# Patient Record
Sex: Female | Born: 1945 | Race: White | Hispanic: No | Marital: Married | Smoking: Former smoker
Health system: Southern US, Community
[De-identification: ages and names within clinical notes are randomized; demographics above are authoritative.]

## PROBLEM LIST (undated history)

## (undated) DIAGNOSIS — I10 Essential (primary) hypertension: Secondary | ICD-10-CM

## (undated) DIAGNOSIS — I255 Ischemic cardiomyopathy: Secondary | ICD-10-CM

## (undated) DIAGNOSIS — I2109 ST elevation (STEMI) myocardial infarction involving other coronary artery of anterior wall: Secondary | ICD-10-CM

## (undated) DIAGNOSIS — E785 Hyperlipidemia, unspecified: Secondary | ICD-10-CM

## (undated) DIAGNOSIS — E875 Hyperkalemia: Secondary | ICD-10-CM

## (undated) DIAGNOSIS — G40909 Epilepsy, unspecified, not intractable, without status epilepticus: Secondary | ICD-10-CM

## (undated) DIAGNOSIS — I2510 Atherosclerotic heart disease of native coronary artery without angina pectoris: Secondary | ICD-10-CM

## (undated) HISTORY — DX: ST elevation (STEMI) myocardial infarction involving other coronary artery of anterior wall: I21.09

## (undated) HISTORY — DX: Epilepsy, unspecified, not intractable, without status epilepticus: G40.909

## (undated) HISTORY — DX: Essential (primary) hypertension: I10

## (undated) HISTORY — DX: Atherosclerotic heart disease of native coronary artery without angina pectoris: I25.10

## (undated) HISTORY — DX: Ischemic cardiomyopathy: I25.5

## (undated) HISTORY — DX: Hyperkalemia: E87.5

## (undated) HISTORY — PX: NO PAST SURGERIES: SHX2092

## (undated) HISTORY — DX: Hyperlipidemia, unspecified: E78.5

---

## 2006-06-11 ENCOUNTER — Ambulatory Visit (HOSPITAL_COMMUNITY): Admission: RE | Admit: 2006-06-11 | Discharge: 2006-06-11 | Payer: Self-pay | Admitting: Family Medicine

## 2006-08-03 ENCOUNTER — Ambulatory Visit (HOSPITAL_COMMUNITY): Admission: RE | Admit: 2006-08-03 | Discharge: 2006-08-03 | Payer: Self-pay | Admitting: Family Medicine

## 2008-10-28 ENCOUNTER — Ambulatory Visit (HOSPITAL_COMMUNITY): Admission: RE | Admit: 2008-10-28 | Discharge: 2008-10-28 | Payer: Self-pay | Admitting: Family Medicine

## 2009-07-17 DIAGNOSIS — I2109 ST elevation (STEMI) myocardial infarction involving other coronary artery of anterior wall: Secondary | ICD-10-CM

## 2009-07-17 HISTORY — PX: CORONARY ANGIOPLASTY WITH STENT PLACEMENT: SHX49

## 2009-07-17 HISTORY — DX: ST elevation (STEMI) myocardial infarction involving other coronary artery of anterior wall: I21.09

## 2010-02-14 ENCOUNTER — Inpatient Hospital Stay (HOSPITAL_COMMUNITY): Admission: RE | Admit: 2010-02-14 | Discharge: 2010-02-17 | Payer: Self-pay

## 2010-02-14 ENCOUNTER — Ambulatory Visit: Payer: Self-pay | Admitting: Internal Medicine

## 2010-02-16 ENCOUNTER — Ambulatory Visit: Payer: Self-pay | Admitting: Internal Medicine

## 2010-02-16 ENCOUNTER — Encounter: Payer: Self-pay | Admitting: Cardiology

## 2010-03-07 ENCOUNTER — Ambulatory Visit: Payer: Self-pay | Admitting: Cardiology

## 2010-03-07 DIAGNOSIS — E782 Mixed hyperlipidemia: Secondary | ICD-10-CM | POA: Insufficient documentation

## 2010-03-07 DIAGNOSIS — I2510 Atherosclerotic heart disease of native coronary artery without angina pectoris: Secondary | ICD-10-CM | POA: Insufficient documentation

## 2010-03-07 DIAGNOSIS — I959 Hypotension, unspecified: Secondary | ICD-10-CM | POA: Insufficient documentation

## 2010-03-07 DIAGNOSIS — Z87891 Personal history of nicotine dependence: Secondary | ICD-10-CM | POA: Insufficient documentation

## 2010-03-10 ENCOUNTER — Encounter: Payer: Self-pay | Admitting: Cardiology

## 2010-03-10 LAB — CONVERTED CEMR LAB
Basophils Absolute: 0.1 10*3/uL (ref 0.0–0.1)
Cholesterol: 125 mg/dL (ref 0–200)
Eosinophils Absolute: 0.3 10*3/uL (ref 0.0–0.7)
HCT: 35.5 % — ABNORMAL LOW (ref 36.0–46.0)
HDL: 39.1 mg/dL (ref >39.00)
LDL Cholesterol: 71 mg/dL (ref 0–99)
Lymphs Abs: 3 10*3/uL (ref 0.7–4.0)
MCHC: 34.3 g/dL (ref 30.0–36.0)
MCV: 94.6 fL (ref 78.0–100.0)
Monocytes Absolute: 0.9 10*3/uL (ref 0.1–1.0)
Neutro Abs: 5.4 10*3/uL (ref 1.4–7.7)
RBC: 3.76 M/uL — ABNORMAL LOW (ref 3.87–5.11)
Sodium: 141 meq/L (ref 135–145)
Total CHOL/HDL Ratio: 3
VLDL: 14.6 mg/dL (ref 0.0–40.0)
WBC: 9.7 10*3/uL (ref 4.5–10.5)

## 2010-03-22 ENCOUNTER — Telehealth: Payer: Self-pay | Admitting: Cardiology

## 2010-04-11 ENCOUNTER — Encounter: Payer: Self-pay | Admitting: Cardiology

## 2010-05-11 ENCOUNTER — Ambulatory Visit: Payer: Self-pay | Admitting: Cardiology

## 2010-05-11 ENCOUNTER — Ambulatory Visit: Payer: Self-pay | Admitting: Cardiovascular Disease

## 2010-05-11 ENCOUNTER — Encounter: Payer: Self-pay | Admitting: Cardiology

## 2010-05-11 ENCOUNTER — Ambulatory Visit: Payer: Self-pay

## 2010-05-11 ENCOUNTER — Ambulatory Visit (HOSPITAL_COMMUNITY): Admission: RE | Admit: 2010-05-11 | Discharge: 2010-05-11 | Payer: Self-pay | Admitting: Cardiology

## 2010-05-13 LAB — CONVERTED CEMR LAB
CO2: 29 meq/L (ref 19–32)
Calcium: 9.4 mg/dL (ref 8.4–10.5)
Creatinine, Ser: 0.7 mg/dL (ref 0.4–1.2)
GFR calc non Af Amer: 90.72 mL/min (ref >60)
Glucose, Bld: 77 mg/dL (ref 70–99)
Potassium: 4.6 meq/L (ref 3.5–5.1)

## 2010-05-17 ENCOUNTER — Encounter: Payer: Self-pay | Admitting: Cardiology

## 2010-05-19 ENCOUNTER — Ambulatory Visit: Payer: Self-pay | Admitting: Cardiology

## 2010-05-19 ENCOUNTER — Inpatient Hospital Stay (HOSPITAL_COMMUNITY): Admission: RE | Admit: 2010-05-19 | Discharge: 2010-05-20 | Payer: Self-pay | Admitting: Cardiology

## 2010-06-08 ENCOUNTER — Ambulatory Visit: Payer: Self-pay | Admitting: Cardiology

## 2010-08-08 ENCOUNTER — Ambulatory Visit
Admission: RE | Admit: 2010-08-08 | Discharge: 2010-08-08 | Payer: Self-pay | Source: Home / Self Care | Attending: Cardiology | Admitting: Cardiology

## 2010-08-16 NOTE — Progress Notes (Signed)
Summary: Need ECHO  Phone Note Outgoing Call   Call placed by: Julieta Gutting, RN, BSN,  March 22, 2010 1:35 PM Call placed to: Patient Summary of Call: Lauren She needs an echo repeated at the time of GXT.  Thanks Tom.  Echo scheduled on 04/13/10 arrive at 9:30, GXT after ECHO. Pt aware of ECHO. Initial call taken by: Julieta Gutting, RN, BSN,  March 22, 2010 1:40 PM

## 2010-08-16 NOTE — Letter (Signed)
Summary: Cardiac Catheterization Instructions- Main Lab  [facility], Main Office  [address]   [place], [place] [address]   Phone: [phone]  Fax: [phone]     05/17/2010 MRN: [id number]  Destiny Hoffman [address] [place], [place]  [address]  Dear Ms. STOFFEL,   You are scheduled for Cardiac Catheterization on Thursday May 19, 2010 with Dr. Riley Kill.  Please arrive at the [facility] at 12:00      noon on the day of your procedure.  1. DIET     _X___ Nothing to eat after midnight.  You may have clear liquids until 8:00 AM, then nothing by mouth.  2. MAKE SURE YOU TAKE YOUR ASPIRIN AND PLAVIX.  3. __X__ YOU MAY TAKE ALL of your remaining medications with a small amount of water.      4. Plan for one night stay - bring personal belongings (i.e. toothpaste, toothbrush, etc.)  5. Bring a current list of your medications and current insurance cards.  6. Must have a responsible person to drive you home.   7. Someone must be with you for the first 24 hours after you arrive home.  8. Please wear clothes that are easy to get on and off and wear slip-on shoes.  *Special note: Every effort is made to have your procedure done on time.  Occasionally there are emergencies that present themselves at the hospital that may cause delays.  Please be patient if a delay does occur.  If you have any questions after you get home, please call the office at the number listed above.  Julieta Gutting, RN, BSN

## 2010-08-16 NOTE — Miscellaneous (Signed)
Summary: Orders Update  Clinical Lists Changes  Orders: Added new Referral order of Cardiac Catheterization (Cardiac Cath) - Signed 

## 2010-08-16 NOTE — Cardiovascular Report (Signed)
Summary: Cath/Percutaneous Orders   Cath/Percutaneous Orders   Imported By: Roderic Ovens 05/23/2010 09:22:58  _____________________________________________________________________  External Attachment:    Type:   Image     Comment:   External Document

## 2010-08-16 NOTE — Assessment & Plan Note (Signed)
Summary: eph   Visit Type:  Post-hospital  CC:  No complains.  History of Present Illness: No chest pain since discharge from the hospital.  Has stopped smoking.  It was not hard for her.  When she was in the ICU, she had a pastor to come by, and they knew she smoked.  She committed to them that she would stop.    Problems Prior to Update: 1)  Hypotension  (ICD-458.9) 2)  Tobacco Use, Quit  (ICD-V15.82) 3)  Hypercholesterolemia Iia  (ICD-272.0) 4)  Hypercholesterolemia Iia  (ICD-272.0) 5)  Cad, Native Vessel  (ICD-414.01)  Current Medications (verified): 1)  Aspirin Ec 325 Mg Tbec (Aspirin) .... Take One Tablet By Mouth Daily 2)  Plavix 75 Mg Tabs (Clopidogrel Bisulfate) .... Take One Tablet By Mouth Daily 3)  Lisinopril 10 Mg Tabs (Lisinopril) .... Take One Tablet By Mouth Daily 4)  Metoprolol Tartrate 25 Mg Tabs (Metoprolol Tartrate) .... Take  1/2  Tablet By Mouth Twice A Day 5)  Nitrostat 0.4 Mg Subl (Nitroglycerin) .Marland Kitchen.. 1 Tablet Under Tongue At Onset of Chest Pain; You May Repeat Every 5 Minutes For Up To 3 Doses. 6)  Crestor 20 Mg Tabs (Rosuvastatin Calcium) .... Take 1 Tablet By Mouth Once A Day 7)  Phenobarbital 30 Mg Tabs (Phenobarbital) .... Take 1 Tablet By Mouth Two Times A Day 8)  Excedrin Migraine 250-250-65 Mg Tabs (Aspirin-Acetaminophen-Caffeine) .... As Needed  Allergies (verified): No Known Drug Allergies  Past History:  Past Medical History: Myocardial infarction 02/14/2010 Seizure disorder.  Family History: Father deceased secondary to MI age 56  Social History: Reviewed history and no changes required. Married. Two children.  Stop smoling at time of MI.   Vital Signs:  Patient profile:   65 year old female Height:      62 inches Weight:      102.75 pounds BMI:     18.86 Pulse rate:   56 / minute Pulse rhythm:   regular Resp:     18 per minute BP sitting:   96 / 58  (left arm) Cuff size:   small  Vitals Entered By: Vikki Ports (March 07, 2010 12:36 PM)  Physical Exam  General:  Thin older female in no distress at present.   Head:  normocephalic and atraumatic Eyes:  PERRLA/EOM intact; conjunctiva and lids normal. Neck:  No obvious JVD. Lungs:  Moderatte decrease breath sounds bilaterally.  No rales Heart:  PMI non displaced.  NOrmal S1 and S2.  Pos S4.  No murmur noted. Pulses:  pulses normal in all 4 extremities Extremities:  No clubbing or cyanosis. Neurologic:  Alert and oriented x 3.   EKG  Procedure date:  03/07/2010  Findings:      NSR.  T inversion anteriorly.  ST change with T change consistent with recent anterior MI.   Cardiac Cath  Procedure date:  02/14/2010  Findings:      NGIOGRAPHIC DATA: 1. On plain fluoroscopy, there was moderate calcification particularly     in the LAD. 2. Ventriculography in the RAO projection reveals an anteroapical wall     motion abnormality with estimated ejection fraction of 35-40%.  The     segment is akinetic.  There is mild mitral regurgitation. 3. The right coronary artery is a smaller caliber vessel.  There was a     segmental lesion of about 80%, the mid vessel then providing a     posterior descending and posterolateral branch posteriorly. 4. The left  main is free of critical disease. 5. The LAD courses to the apex just after the takeoff of the small     first diagonal, there is total occlusion of the LAD.  This was a     somewhat stiff lesion, but ultimately opened nicely with a balloon     and stent was reduced to 0% residual luminal narrowing.  The vessel     tapers distally, but provides an apical vessel and then supplies a     significant portion of the distal inferior wall.  There is probably     about 50% narrowing at the ostium of the first diagonal and a     little bit of mild pinching of the second diagonal, but no critical     stenoses. 6. The circumflex is a fairly large-caliber vessel, there are two     modest marginal branches followed by  about a 30% area of stenosis.     The vessel then goes posteriorly.  There is a distal marginal     branch that has about a 50% area of stenosis distally as well.  No     critical lesions are noted.   CONCLUSIONS: 1. Moderately severe reduction in left ventricular function. 2. Total occlusion of the left anterior descending artery acutely with     successful reperfusion using a non-drug-eluting stent. 3. High-grade lesion of the mid right coronary artery. 4. Total balloon time of 20 minutes.   DISPOSITION: 1. The patient will be treated with aspirin and Plavix. 2. P2Y12 testing will be performed. 3. Cardiac rehab. 4. Dual antiplatelet therapy. 5. Beta blockade. 6. ACE inhibition. 7. Statin therapy.  Echocardiogram  Procedure date:  02/16/2010  Findings:       Study Conclusions   Left ventricle: LVEF is severely depressed at approximately 20 to   25% with akinesis of the mid/distal septum, mid,distal anterior,   distal inferior, distal lateral, and apical walls. The cavity size   was normal. Wall thickness was normal.   Impression & Recommendations:  Problem # 1:  MYOCARDIAL  INFARCTION, ANTEROLATERAL WALL, SUBSEQUENT CARE (ICD-410.02) Appears to be doing well.  Has residual RCA discease, but has sig reduction in LVEF.  Currently class I.  Will need subsequent echo done in about six weeks to reassess status, then decision re: consideration of ICD.  Because of size and BP, medicatons will be difficult to optimize in terms of LV reduction and remodeling.  Her updated medication list for this problem includes:    Aspirin Ec 325 Mg Tbec (Aspirin) .Marland Kitchen... Take one tablet by mouth daily    Plavix 75 Mg Tabs (Clopidogrel bisulfate) .Marland Kitchen... Take one tablet by mouth daily    Lisinopril 10 Mg Tabs (Lisinopril) .Marland Kitchen... Take one-half  tablet by mouth daily    Metoprolol Tartrate 25 Mg Tabs (Metoprolol tartrate) .Marland Kitchen... Take  1/2  tablet by mouth twice a day    Nitrostat 0.4 Mg Subl  (Nitroglycerin) .Marland Kitchen... 1 tablet under tongue at onset of chest pain; you may repeat every 5 minutes for up to 3 doses.  Problem # 2:  CAD, NATIVE VESSEL (ICD-414.01) residual disease of the RCA.  At present, would hold off as she is not symptomatic.  Her updated medication list for this problem includes:    Aspirin Ec 325 Mg Tbec (Aspirin) .Marland Kitchen... Take one tablet by mouth daily    Plavix 75 Mg Tabs (Clopidogrel bisulfate) .Marland Kitchen... Take one tablet by mouth daily    Lisinopril 10  Mg Tabs (Lisinopril) .Marland Kitchen... Take one-half  tablet by mouth daily    Metoprolol Tartrate 25 Mg Tabs (Metoprolol tartrate) .Marland Kitchen... Take  1/2  tablet by mouth twice a day    Nitrostat 0.4 Mg Subl (Nitroglycerin) .Marland Kitchen... 1 tablet under tongue at onset of chest pain; you may repeat every 5 minutes for up to 3 doses.  Orders: EKG w/ Interpretation (93[address]) TLB-BMP (Basic Metabolic Panel-BMET) (80048-METABOL) TLB-CBC Platelet - w/Differential (85025-CBCD) TLB-Lipid Panel (80061-LIPID) TLB-Hepatic/Liver Function Pnl (80076-HEPATIC) Treadmill (Treadmill)  Problem # 3:  HYPOTENSION (ICD-458.9) Will reduce Lisinopril to 5mg  per day at present, and monitor BP with early followup.  Problem # 4:  HYPERCHOLESTEROLEMIA  IIA (ICD-272.0) On LLT post MI.  Will need recheck in near future.  Her updated medication list for this problem includes:    Crestor 20 Mg Tabs (Rosuvastatin calcium) .Marland Kitchen... Take 1 tablet by mouth once a day  Orders: EKG w/ Interpretation (93000) TLB-BMP (Basic Metabolic Panel-BMET) (80048-METABOL) TLB-CBC Platelet - w/Differential (85025-CBCD) TLB-Lipid Panel (80061-LIPID) TLB-Hepatic/Liver Function Pnl (80076-HEPATIC) Treadmill (Treadmill)  Patient Instructions: 1)  Your physician recommends that you have lab work today: CBC, BMP, LIPID, LIVER 2)  Your physician has requested that you have an exercise tolerance test.  For further information please visit [email or web address]  Please also follow instruction sheet, as  given. 3)  Your physician has recommended you make the following change in your medication: DECREASE Lisinopril to 10mg  take one-half tablet by mouth daily

## 2010-08-16 NOTE — Assessment & Plan Note (Signed)
Summary: EPH PER NIKKI CALL FROM [facility]   Visit Type:  Follow-up Primary Provider:  Dr.Angus Mcinnis   History of Present Illness: Ms. Jenkin is in for followup after undergoing repeat cardiac cath.  Overall she is stable and feeling well.  She is able to walk without chest pain, and getting around without difficulty.  We spent a long time reviewing her films today, talking about symptoms and possible strategies.  She is asymptomatic, with a patent stent in her acute infarct area, and high grade residual in the RCA.  However, this vessel is small in caliber, has a lengthy lesion, and is less than ideal for PCI.  We have offered her two options and discussed the contingencies and issues with both.  She prefers a watchful waiting, conservative course of management in the absence of progressive symptoms.   Current Medications (verified): 1)  Aspirin Ec 325 Mg Tbec (Aspirin) .... Take One Tablet By Mouth Daily 2)  Plavix 75 Mg Tabs (Clopidogrel Bisulfate) .... Take One Tablet By Mouth Daily 3)  Metoprolol Tartrate 25 Mg Tabs (Metoprolol Tartrate) .... Take  1/2  Tablet By Mouth Twice A Day 4)  Nitrostat 0.4 Mg Subl (Nitroglycerin) .Marland Kitchen.. 1 Tablet Under Tongue At Onset of Chest Pain; You May Repeat Every 5 Minutes For Up To 3 Doses. 5)  Crestor 20 Mg Tabs (Rosuvastatin Calcium) .... Take 1 Tablet By Mouth Once A Day 6)  Phenobarbital 30 Mg Tabs (Phenobarbital) .... Take 1 Tablet By Mouth Two Times A Day 7)  Excedrin Migraine 250-250-65 Mg Tabs (Aspirin-Acetaminophen-Caffeine) .... As Needed  Allergies (verified): No Known Drug Allergies  Comments:  Nurse/Medical Assistant: patient and i reviewed meds and she stated that all meds are correct  Vital Signs:  Patient profile:   65 year old female Weight:      108 pounds BMI:     19.82 Pulse rate:   66 / minute BP sitting:   112 / 66  (right arm)  Vitals Entered By: Dreama Saa, CNA (June 08, 2010 10:23 AM)  Physical  Exam  General:  Well developed, well nourished, in no acute distress. Head:  normocephalic and atraumatic Eyes:  PERRLA/EOM intact; conjunctiva and lids normal. Lungs:  Clear bilaterally to auscultation and percussion. Heart:  PMI non displaced.  Normal S1 and S2.  No murmurs or rubs.  Msk:  Back normal, normal gait. Muscle strength and tone normal. Pulses:  pulses normal in all 4 extremities Extremities:  Groin stable. Neurologic:  Alert and oriented x 3.   Impression & Recommendations:  Problem # 1:  CAD, NATIVE VESSEL (ICD-414.01) She will remain on DAPT.  All issues discussed in detail with her, and she prefers a conservative course of action. Will monitor closely. Her updated medication list for this problem includes:    Aspirin Ec 325 Mg Tbec (Aspirin) .Marland Kitchen... Take one tablet by mouth daily    Plavix 75 Mg Tabs (Clopidogrel bisulfate) .Marland Kitchen... Take one tablet by mouth daily    Metoprolol Tartrate 25 Mg Tabs (Metoprolol tartrate) .Marland Kitchen... Take  1/2  tablet by mouth twice a day    Nitrostat 0.4 Mg Subl (Nitroglycerin) .Marland Kitchen... 1 tablet under tongue at onset of chest pain; you may repeat every 5 minutes for up to 3 doses.  Problem # 2:  HYPERCHOLESTEROLEMIA  IIA (ICD-272.0) remains on lipid lowering treatment at present.  Her updated medication list for this problem includes:    Crestor 20 Mg Tabs (Rosuvastatin calcium) .Marland Kitchen... Take 1 tablet by  mouth once a day  Patient Instructions: 1)  Your physician recommends that you schedule a follow-up appointment in: 2 MONTHS

## 2010-08-16 NOTE — Medication Information (Signed)
Summary: Lab Orders  Lab Orders   Imported By: Marylou Mccoy 04/25/2010 10:41:17  _____________________________________________________________________  External Attachment:    Type:   Image     Comment:   External Document

## 2010-08-16 NOTE — Medication Information (Signed)
Summary: Physician's Orders   Physician's Orders   Imported By: Roderic Ovens 04/15/2010 12:05:51  _____________________________________________________________________  External Attachment:    Type:   Image     Comment:   External Document

## 2010-08-18 NOTE — Assessment & Plan Note (Signed)
Summary: F2M   Visit Type:  Follow-up Primary Provider:  Dr.Angus Mcinnis  CC:  No complaints.  History of Present Illness: She is doing well.  Her friends will not walk with her any more because she moves to fast. She denies any symptoms at all on optimal medical therapy.  She feels great.  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  Problems Prior to Update: 1)  Myocardial Infarction, Anterolateral Wall, Subsequent Care  (ICD-410.02) 2)  Hypotension  (ICD-458.9) 3)  Tobacco Use, Quit  (ICD-V15.82) 4)  Hypercholesterolemia Iia  (ICD-272.0) 5)  Hypercholesterolemia Iia  (ICD-272.0) 6)  Cad, Native Vessel  (ICD-414.01)  Current Medications (verified): 1)  Plavix 75 Mg Tabs (Clopidogrel Bisulfate) .... Take One Tablet By Mouth Daily 2)  Metoprolol Tartrate 25 Mg Tabs (Metoprolol Tartrate) .... Take  1/2  Tablet By Mouth Twice A Day 3)  Nitrostat 0.4 Mg Subl (Nitroglycerin) .Marland Kitchen.. 1 Tablet Under Tongue At Onset of Chest Pain; You May Repeat Every 5 Minutes For Up To 3 Doses. 4)  Crestor 20 Mg Tabs (Rosuvastatin Calcium) .... Take 1 Tablet By Mouth Once A Day 5)  Phenobarbital 30 Mg Tabs (Phenobarbital) .... Take 1 Tablet By Mouth Two Times A Day 6)  Excedrin Migraine 250-250-65 Mg Tabs (Aspirin-Acetaminophen-Caffeine) .... As Needed 7)  Aspirin 81 Mg Tbec (Aspirin) .... Take One Tablet By Mouth Daily  Allergies (verified): No Known Drug Allergies  Vital Signs:  Patient profile:   65 year old female Height:      62 inches Weight:      113.75 pounds BMI:     20.88 Pulse rate:   59 / minute Pulse rhythm:   regular Resp:     18 per minute BP sitting:   120 / 74  (left arm) Cuff size:   large  Vitals Entered By: Vikki Ports (August 08, 2010 10:16 AM)  Physical Exam  General:  Thin, well kept female no distress. Head:  normocephalic and atraumatic Eyes:  PERRLA/EOM intact; conjunctiva and lids  normal. Lungs:  Clear bilaterally to auscultation and percussion. Heart:  PMI non displaced. Normal S1 and S2.  No murmurs.  Extremities:  No clubbing or cyanosis. Neurologic:  Alert and oriented x 3.   Impression & Recommendations:  Problem # 1:  CAD, NATIVE VESSEL (ICD-414.01) Doing really well on medication.  No symptoms.  Plans well documented in prior notes.  Reevaluate in three months, and meanwhile continue optimal medical therapy.  She wants to return to fifteen hours of light work, and I think that is ok. The following medications were removed from the medication list:    Aspirin Ec 325 Mg Tbec (Aspirin) .Marland Kitchen... Take one tablet by mouth daily Her updated medication list for this problem includes:    Plavix 75 Mg Tabs (Clopidogrel bisulfate) .Marland Kitchen... Take one tablet by mouth daily    Metoprolol Tartrate 25 Mg Tabs (Metoprolol tartrate) .Marland Kitchen... Take  1/2  tablet by mouth twice a day    Nitrostat 0.4 Mg Subl (Nitroglycerin) .Marland Kitchen... 1 tablet under tongue at onset of chest pain; you may repeat every 5 minutes for up to 3 doses.    Aspirin 81 Mg Tbec (Aspirin) .Marland Kitchen... Take one tablet by mouth daily  Problem # 2:  HYPERCHOLESTEROLEMIA  IIA (ICD-272.0)  Follows with Dr. Renard Matter.  Her updated medication list for this problem includes:    Crestor 20 Mg Tabs (Rosuvastatin  calcium) .Marland Kitchen... Take 1 tablet by mouth once a day  Her updated medication list for this problem includes:    Crestor 20 Mg Tabs (Rosuvastatin calcium) .Marland Kitchen... Take 1 tablet by mouth once a day  Problem # 3:  TOBACCO USE, QUIT (ICD-V15.82) Has not restarted.   Patient Instructions: 1)  Your physician recommends that you continue on your current medications as directed. Please refer to the Current Medication list given to you today. 2)  Your physician wants you to follow-up in: 3 MONTHS.   You will receive a reminder letter in the mail two months in advance. If you don't receive a letter, please call our office to schedule the  follow-up appointment. Prescriptions: CRESTOR 20 MG TABS (ROSUVASTATIN CALCIUM) Take 1 tablet by mouth once a day  #30 x 8   Entered by:   Julieta Gutting, RN, BSN   Authorized by:   Ronaldo Miyamoto, MD, [facility]   Signed by:   Julieta Gutting, RN, BSN on 08/08/2010   Method used:   Electronically to        [facility] (retail)       [address]       [place], [place]  [address]       Ph: [phone]       Fax: [phone]   RxID:   [id number] METOPROLOL TARTRATE 25 MG TABS (METOPROLOL TARTRATE) Take  1/2  tablet by mouth twice a day  #30 x 8   Entered by:   Julieta Gutting, RN, BSN   Authorized by:   Ronaldo Miyamoto, MD, [facility]   Signed by:   Julieta Gutting, RN, BSN on 08/08/2010   Method used:   Electronically to        [facility] (retail)       [address]       [place], [place]  [address]       Ph: [phone]       Fax: [phone]   RxID:   [id number] PLAVIX 75 MG TABS (CLOPIDOGREL BISULFATE) Take one tablet by mouth daily  #30 x 8   Entered by:   Julieta Gutting, RN, BSN   Authorized by:   Ronaldo Miyamoto, MD, [facility]   Signed by:   Julieta Gutting, RN, BSN on 08/08/2010   Method used:   Electronically to        [facility] (retail)       [address]       [place], [place]  [address]       Ph: [phone]       Fax: [phone]   RxID:   [id number]

## 2010-09-27 LAB — POCT I-STAT 3, ART BLOOD GAS (G3+)
Acid-base deficit: 2 mmol/L (ref 0.0–2.0)
Bicarbonate: 22.9 mEq/L (ref 20.0–24.0)
pH, Arterial: 7.389 (ref 7.350–7.400)
pO2, Arterial: 72 mmHg — ABNORMAL LOW (ref 80.0–100.0)

## 2010-09-27 LAB — POCT I-STAT 3, VENOUS BLOOD GAS (G3P V)
Bicarbonate: 21.9 mEq/L (ref 20.0–24.0)
O2 Saturation: 64 %
TCO2: 23 mmol/L (ref 0–100)
pCO2, Ven: 39.5 mmHg — ABNORMAL LOW (ref 45.0–50.0)
pO2, Ven: 35 mmHg (ref 30.0–45.0)

## 2010-09-27 LAB — PLATELET INHIBITION P2Y12: P2Y12 % Inhibition: 79 %

## 2010-09-27 LAB — BASIC METABOLIC PANEL
Calcium: 9.4 mg/dL (ref 8.4–10.5)
Chloride: 107 mEq/L (ref 96–112)
Chloride: 110 mEq/L (ref 96–112)
Glucose, Bld: 92 mg/dL (ref 70–99)
Potassium: 5.5 mEq/L — ABNORMAL HIGH (ref 3.5–5.1)
Sodium: 141 mEq/L (ref 135–145)

## 2010-09-27 LAB — CBC
HCT: 37.1 % (ref 36.0–46.0)
Hemoglobin: 11.9 g/dL — ABNORMAL LOW (ref 12.0–15.0)
MCH: 30.7 pg (ref 26.0–34.0)
MCH: 30.8 pg (ref 26.0–34.0)
MCHC: 32.1 g/dL (ref 30.0–36.0)
MCV: 95.1 fL (ref 78.0–100.0)
MCV: 96.1 fL (ref 78.0–100.0)
Platelets: 186 10*3/uL (ref 150–400)
Platelets: 203 10*3/uL (ref 150–400)
RBC: 4.11 MIL/uL (ref 3.87–5.11)

## 2010-09-27 LAB — PROTIME-INR
INR: 0.94 (ref 0.00–1.49)
Prothrombin Time: 12.8 seconds (ref 11.6–15.2)

## 2010-09-30 LAB — CBC
HCT: 35.3 % — ABNORMAL LOW (ref 36.0–46.0)
HCT: 35.6 % — ABNORMAL LOW (ref 36.0–46.0)
Hemoglobin: 12.1 g/dL (ref 12.0–15.0)
MCH: 31.4 pg (ref 26.0–34.0)
MCHC: 34.3 g/dL (ref 30.0–36.0)
MCV: 96.3 fL (ref 78.0–100.0)
MCV: 98.9 fL (ref 78.0–100.0)
Platelets: 214 10*3/uL (ref 150–400)
RBC: 3.66 MIL/uL — ABNORMAL LOW (ref 3.87–5.11)
RDW: 12.5 % (ref 11.5–15.5)
RDW: 12.6 % (ref 11.5–15.5)
WBC: 14.1 10*3/uL — ABNORMAL HIGH (ref 4.0–10.5)

## 2010-09-30 LAB — COMPREHENSIVE METABOLIC PANEL
AST: 470 U/L — ABNORMAL HIGH (ref 0–37)
Albumin: 3.6 g/dL (ref 3.5–5.2)
BUN: 15 mg/dL (ref 6–23)
CO2: 21 mEq/L (ref 19–32)
Calcium: 8.5 mg/dL (ref 8.4–10.5)
Chloride: 107 mEq/L (ref 96–112)
Creatinine, Ser: 0.61 mg/dL (ref 0.4–1.2)
GFR calc non Af Amer: >60 mL/min (ref >60)
Total Protein: 6.1 g/dL (ref 6.0–8.3)

## 2010-09-30 LAB — PLATELET INHIBITION P2Y12
P2Y12 % Inhibition: 31 %
P2Y12 % Inhibition: 49 %
Platelet Function  P2Y12: 172 [PRU] — ABNORMAL LOW (ref 194–418)
Platelet Function Baseline: 377 [PRU] (ref 194–418)
Platelet Function Baseline: 391 [PRU] (ref 194–418)

## 2010-09-30 LAB — BASIC METABOLIC PANEL
BUN: 13 mg/dL (ref 6–23)
CO2: 28 mEq/L (ref 19–32)
Chloride: 107 mEq/L (ref 96–112)
Creatinine, Ser: 0.69 mg/dL (ref 0.4–1.2)
Glucose, Bld: 127 mg/dL — ABNORMAL HIGH (ref 70–99)
Potassium: 4.7 mEq/L (ref 3.5–5.1)

## 2010-09-30 LAB — CARDIAC PANEL(CRET KIN+CKTOT+MB+TROPI)
CK, MB: 708.5 ng/mL (ref 0.3–4.0)
Relative Index: 10.5 — ABNORMAL HIGH (ref 0.0–2.5)
Troponin I: >100 ng/mL (ref 0.00–0.06)

## 2010-09-30 LAB — MRSA PCR SCREENING: MRSA by PCR: NEGATIVE

## 2010-09-30 LAB — POCT I-STAT, CHEM 8
Chloride: 108 mEq/L (ref 96–112)
HCT: 37 % (ref 36.0–46.0)
Potassium: 3.8 mEq/L (ref 3.5–5.1)

## 2010-09-30 LAB — PENTOBARBITAL, SERUM: Pentobarbital, Serum: <2 ug/mL

## 2010-12-02 ENCOUNTER — Encounter: Payer: Self-pay | Admitting: Cardiology

## 2010-12-07 ENCOUNTER — Ambulatory Visit (INDEPENDENT_AMBULATORY_CARE_PROVIDER_SITE_OTHER): Payer: Medicare Other | Admitting: Cardiology

## 2010-12-07 ENCOUNTER — Encounter: Payer: Self-pay | Admitting: Cardiology

## 2010-12-07 VITALS — BP 117/71 | HR 66 | Ht 62.0 in | Wt 123.0 lb

## 2010-12-07 DIAGNOSIS — I2510 Atherosclerotic heart disease of native coronary artery without angina pectoris: Secondary | ICD-10-CM

## 2010-12-07 DIAGNOSIS — E7800 Pure hypercholesterolemia, unspecified: Secondary | ICD-10-CM

## 2010-12-07 NOTE — Patient Instructions (Signed)
Your physician recommends that you schedule a follow-up appointment in: 4 MONTHS  Your physician recommends that you continue on your current medications as directed. Please refer to the Current Medication list given to you today.   

## 2010-12-18 NOTE — Assessment & Plan Note (Signed)
Recent labs reveal LDL 60 mg/dl, TC 811, and non HDL chol  70.  Would continue on rosuvastatin.

## 2010-12-18 NOTE — Progress Notes (Signed)
HPI:  Still walking a lot. Having no significant symptoms at the present time.  Denies any chest tightness.  Her treatment plan has been extensively reviewed and she knows to call us with any change in status.    Current Outpatient Prescriptions  Medication Sig Dispense Refill  . aspirin 81 MG tablet Take 81 mg by mouth daily.        Marland Kitchen aspirin-acetaminophen-caffeine (EXCEDRIN MIGRAINE) 250-250-65 MG per tablet Take 1 tablet by mouth every 6 (six) hours as needed.        . clopidogrel (PLAVIX) 75 MG tablet Take 75 mg by mouth daily.        . metoprolol tartrate (LOPRESSOR) 25 MG tablet 1/2 tab po bid       . nitroGLYCERIN (NITROSTAT) 0.4 MG SL tablet Place 0.4 mg under the tongue every 5 (five) minutes as needed.        Marland Kitchen PHENObarbital (LUMINAL) 30 MG tablet Take 30 mg by mouth 2 (two) times daily.        . rosuvastatin (CRESTOR) 20 MG tablet Take 20 mg by mouth daily.          No Known Allergies  Past Medical History  Diagnosis Date  . Myocardial infarction   . Seizure disorder     Past Surgical History  Procedure Date  . 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     Family History  Problem Relation Age of Onset  . Heart attack      History   Social History  . Marital Status: Married    Spouse Name: N/A    Number of Children: N/A  . Years of Education: N/A   Occupational History  . Not on file.   Social History Main Topics  . Smoking status: Never Smoker   . Smokeless tobacco: Never Used  . Alcohol Use: No  . Drug Use: No  . Sexually Active: Not on file   Other Topics Concern  . Not on file   Social History Narrative  . No narrative on file    ROS: Please see the HPI.  All other systems reviewed and negative.  PHYSICAL EXAM:  BP 117/71  Pulse 66  Ht 5\' 2"  (1.575 m)  Wt 123 lb (55.792 kg)  BMI 22.50 kg/m2  General: Well developed, well nourished, in no acute  distress. Head:  Normocephalic and atraumatic. Neck: no JVD Lungs: Clear to auscultation and percussion. Heart: Normal S1 and S2.  No murmur, rubs or gallops.  Abdomen:  Normal bowel sounds; soft; non tender; no organomegaly Pulses: Pulses normal in all 4 extremities. Extremities: No clubbing or cyanosis. No edema. Neurologic: Alert and oriented x 3.  EKG:  ASSESSMENT AND PLAN:

## 2010-12-18 NOTE — Assessment & Plan Note (Signed)
After extensive review, we recommended continued medical therapy based on her anatomy.  She knows to report any change in symptoms.  I encouraged her not to smoke.  She understands.  She walks regularly, and a good distance without any anginal type symptoms.  We will continue to follow her.

## 2011-04-10 ENCOUNTER — Ambulatory Visit (INDEPENDENT_AMBULATORY_CARE_PROVIDER_SITE_OTHER): Payer: Medicare Other | Admitting: Cardiology

## 2011-04-10 ENCOUNTER — Encounter: Payer: Self-pay | Admitting: Cardiology

## 2011-04-10 DIAGNOSIS — I2510 Atherosclerotic heart disease of native coronary artery without angina pectoris: Secondary | ICD-10-CM

## 2011-04-10 DIAGNOSIS — E7800 Pure hypercholesterolemia, unspecified: Secondary | ICD-10-CM

## 2011-04-10 LAB — LIPID PANEL
Cholesterol: 127 mg/dL (ref 0–200)
LDL Cholesterol: 44 mg/dL (ref 0–99)
Total CHOL/HDL Ratio: 2
Triglycerides: 116 mg/dL (ref 0.0–149.0)

## 2011-04-10 LAB — HEPATIC FUNCTION PANEL
AST: 27 U/L (ref 0–37)
Albumin: 4.5 g/dL (ref 3.5–5.2)
Alkaline Phosphatase: 77 U/L (ref 39–117)
Bilirubin, Direct: 0.1 mg/dL (ref 0.0–0.3)

## 2011-04-10 MED ORDER — METOPROLOL TARTRATE 25 MG PO TABS: ORAL_TABLET | ORAL | Status: DC

## 2011-04-10 MED ORDER — ROSUVASTATIN CALCIUM 20 MG PO TABS: 20.0000 mg | ORAL_TABLET | Freq: Every day | ORAL | Status: DC

## 2011-04-10 NOTE — Patient Instructions (Addendum)
Your physician recommends that you have a FASTING lipid and liver profile today.   Your physician wants you to follow-up in: 6 MONTHS. You will receive a reminder letter in the mail two months in advance. If you don't receive a letter, please call our office to schedule the follow-up appointment.  Your physician has recommended you make the following change in your medication: STOP Plavix

## 2011-04-10 NOTE — Progress Notes (Signed)
HPI:  She has been perfectly stable on a medical regimen.  She does not bleed excessively, but admits that she can get wobbly.  Her P2Y12 is quite surpressed by clopidogrel.  No current symptoms.  Now out more than a year from her MI.  She has been playing on the internet, and not walking as much.   Current Outpatient Prescriptions  Medication Sig Dispense Refill  . aspirin 81 MG tablet Take 81 mg by mouth daily.        Marland Kitchen aspirin-acetaminophen-caffeine (EXCEDRIN MIGRAINE) 250-250-65 MG per tablet Take 1 tablet by mouth every 6 (six) hours as needed.        . clopidogrel (PLAVIX) 75 MG tablet Take 75 mg by mouth daily.        . metoprolol tartrate (LOPRESSOR) 25 MG tablet 1/2 tab po bid       . nitroGLYCERIN (NITROSTAT) 0.4 MG SL tablet Place 0.4 mg under the tongue every 5 (five) minutes as needed.        Marland Kitchen PHENObarbital (LUMINAL) 30 MG tablet Take 30 mg by mouth 2 (two) times daily.        . rosuvastatin (CRESTOR) 20 MG tablet Take 20 mg by mouth daily.          No Known Allergies  Past Medical History  Diagnosis Date  . Myocardial infarction   . Seizure disorder     Past Surgical History  Procedure Date  . 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     Family History  Problem Relation Age of Onset  . Heart attack      History   Social History  . Marital Status: Married    Spouse Name: N/A    Number of Children: N/A  . Years of Education: N/A   Occupational History  . Not on file.   Social History Main Topics  . Smoking status: Never Smoker   . Smokeless tobacco: Never Used  . Alcohol Use: No  . Drug Use: No  . Sexually Active: Not on file   Other Topics Concern  . Not on file   Social History Narrative  . No narrative on file    ROS: Please see the HPI.  All other systems reviewed and negative.  PHYSICAL EXAM:  BP 114/70  Pulse 56  Resp 18  Ht 5\' 2"  (1.575 m)  Wt  128 lb 12.8 oz (58.423 kg)  BMI 23.56 kg/m2  General: Well developed, well nourished, in no acute distress. Head:  Normocephalic and atraumatic. Neck: no JVD Lungs: Clear to auscultation and percussion. Heart: Normal S1 and S2.  No murmur, rubs or gallops.  Pulses: Pulses normal in all 4 extremities. Extremities: No clubbing or cyanosis. No edema. Neurologic: Alert and oriented x 3.  EKG:  NSR.  Anterior T changes, no acute. Prior anterior MI.   ASSESSMENT AND PLAN:

## 2011-04-10 NOTE — Assessment & Plan Note (Signed)
Doing well.  Encouraged to walk more.  Had BMS, and out more than a year.  Will stop clopidogrel at this point.  Her risk exceeds her benefit.  She does have other vessel disease, and I am encouraging her to walk more.

## 2011-04-10 NOTE — Assessment & Plan Note (Signed)
Time to check lipid and liver.

## 2011-06-07 IMAGING — CR DG CHEST 2V
2 series · 2 of 2 positions shown · non-contrast
Comparison: 10/28/2008

CLINICAL DATA: Myocardial infarction.  Cough and hypertension.

CHEST - 2 VIEW

[w chest pa]
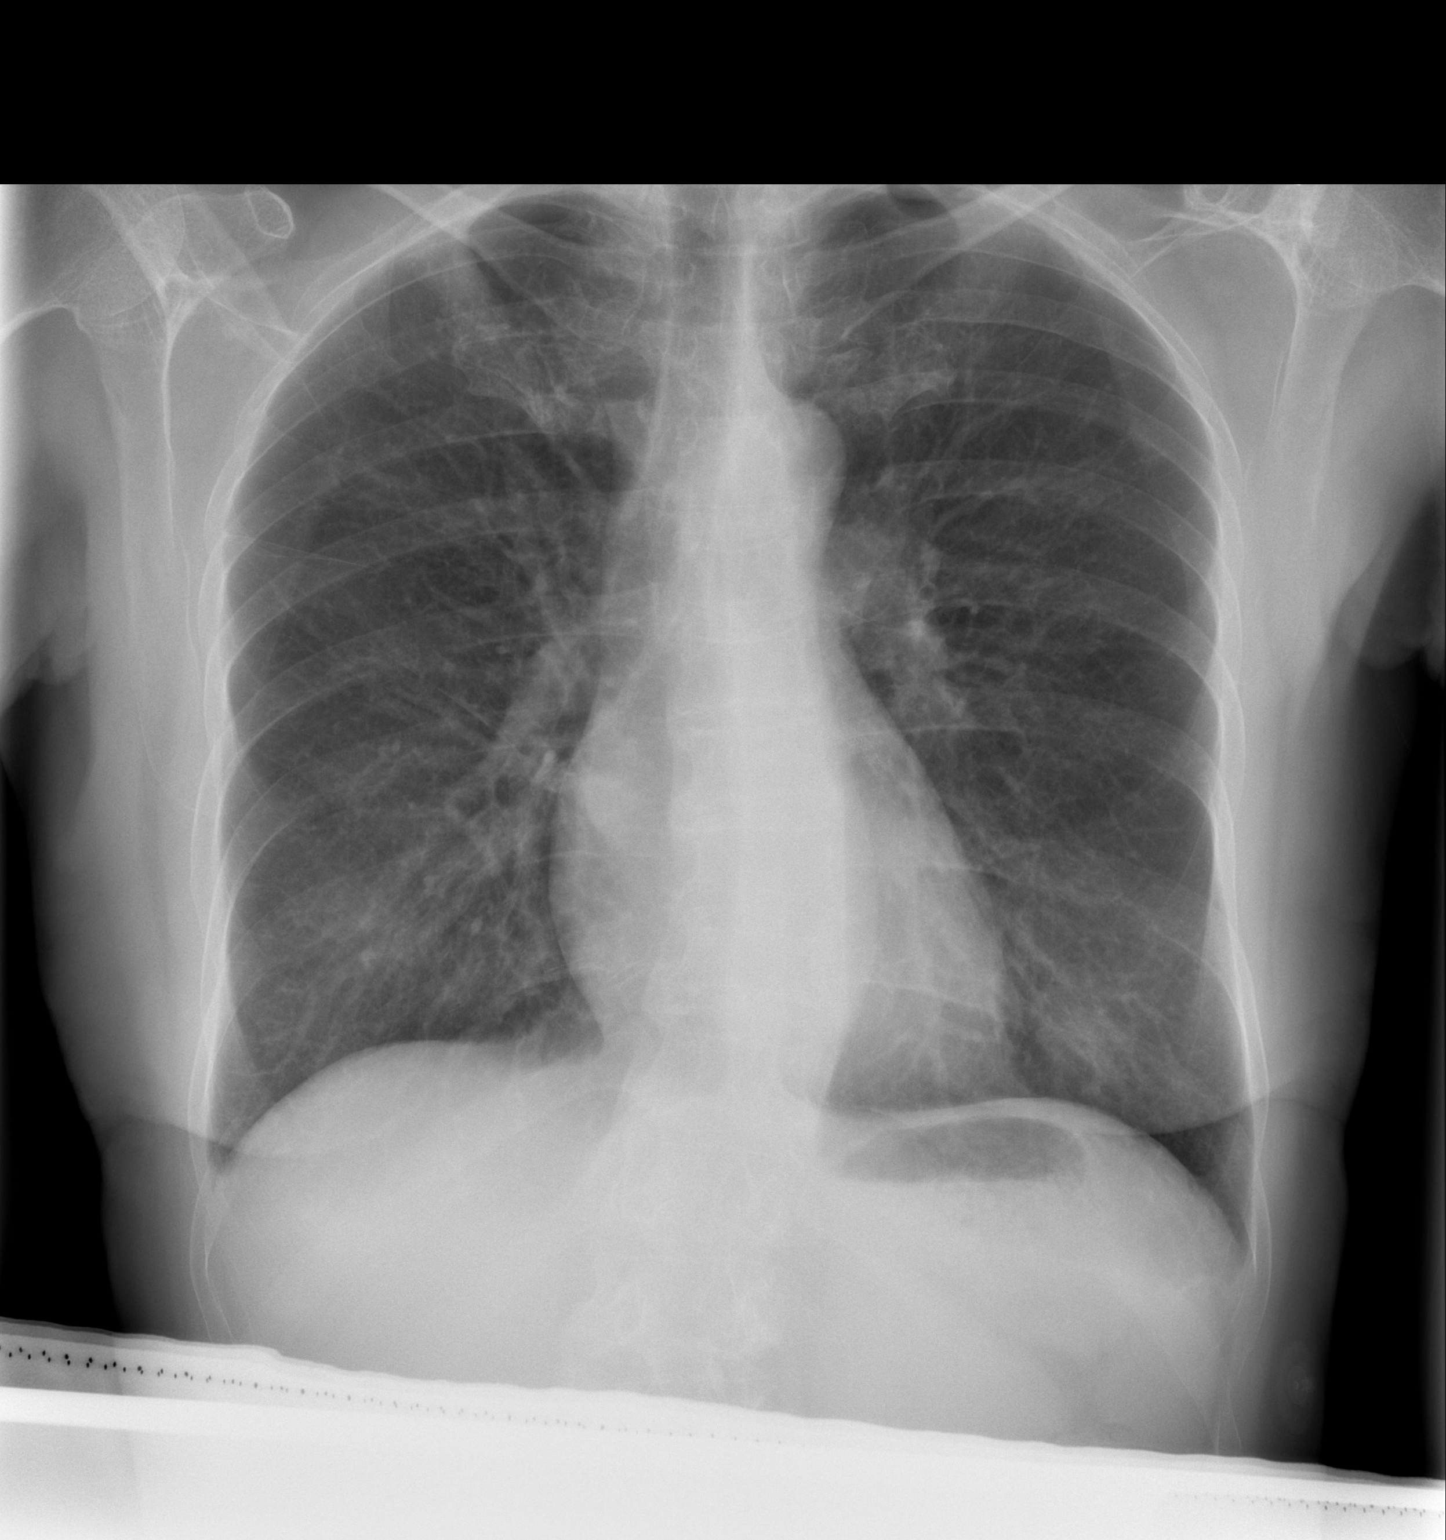

[w chest lat]
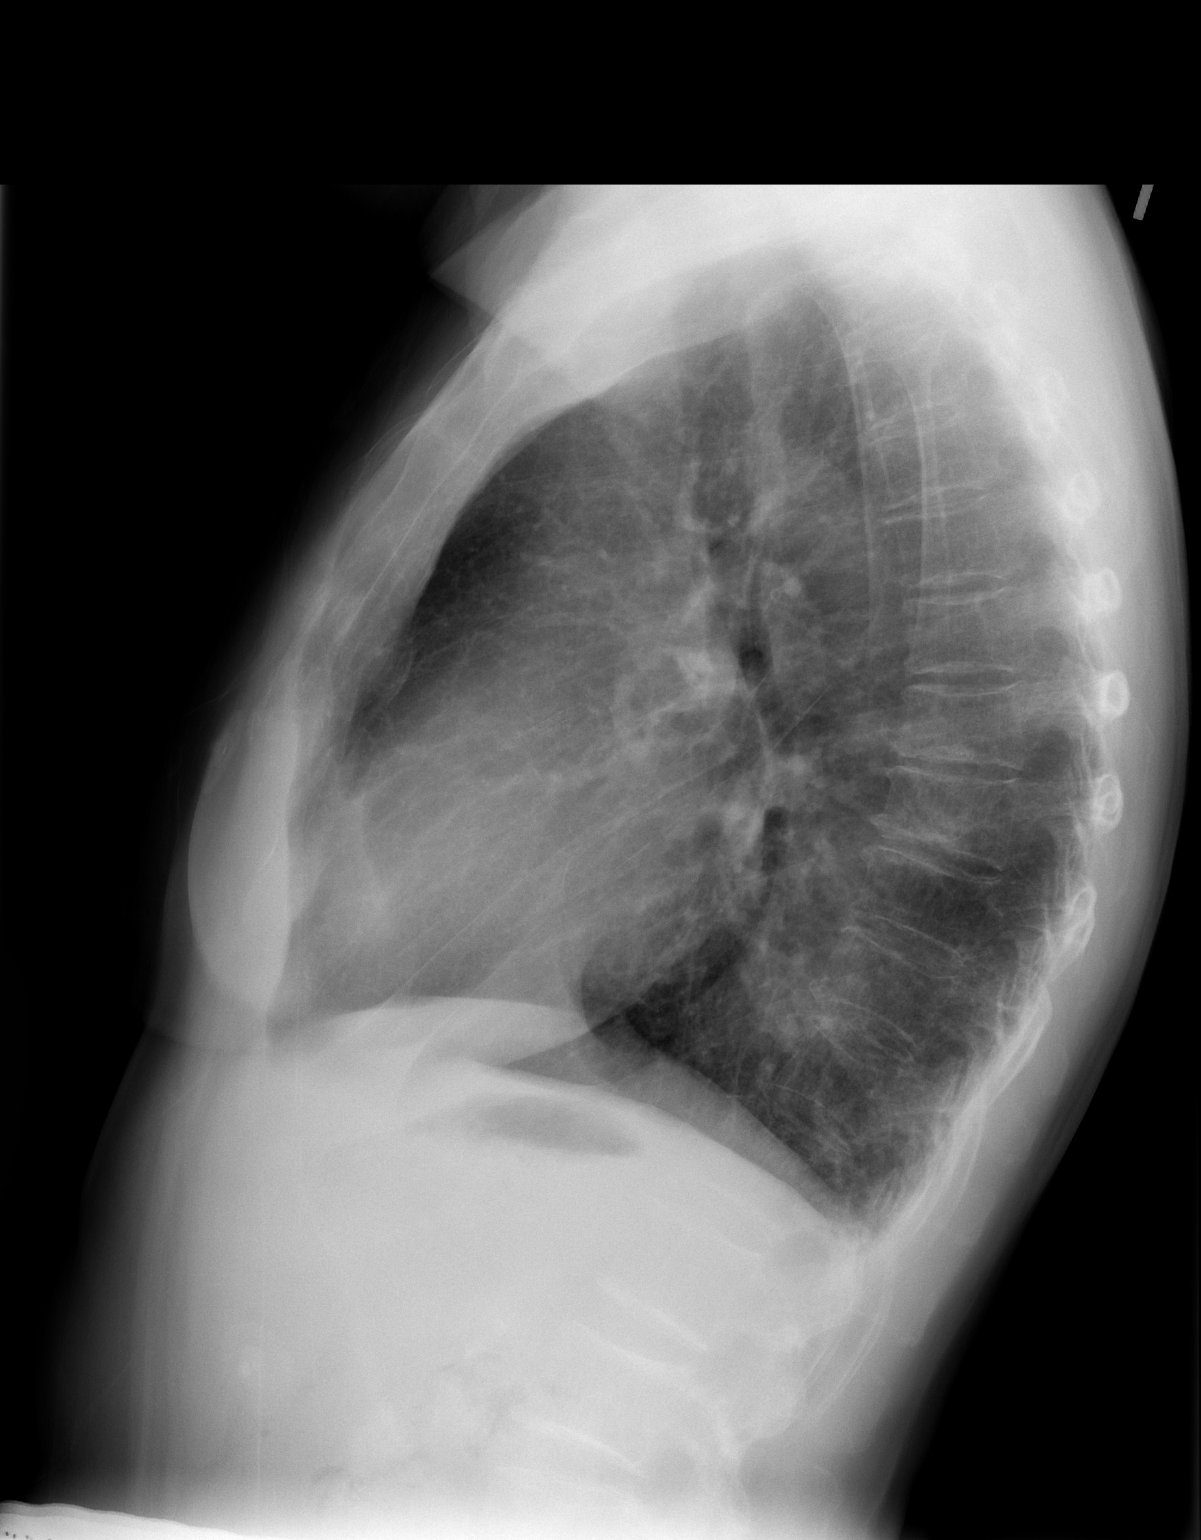

[2 of 2 positions shown; findings below may reference images not displayed]

FINDINGS: There is a scoliosis deformity affecting the thoracic
spine which is convex to the left.

The heart size appears normal.

No pleural effusions or pulmonary edema noted.

There is no airspace consolidation.

Diffuse interstitial coarsening is noted which suggest mild COPD.

Review of the visualized osseous structures is significant for mild
spondylosis.
IMPRESSION: 1.  There is no evidence for acute cardiopulmonary abnormalities.
No heart failure identified.

## 2011-11-03 ENCOUNTER — Ambulatory Visit: Payer: Medicare Other | Admitting: Cardiology

## 2011-11-09 ENCOUNTER — Ambulatory Visit (INDEPENDENT_AMBULATORY_CARE_PROVIDER_SITE_OTHER): Payer: Medicare Other | Admitting: Cardiology

## 2011-11-09 ENCOUNTER — Encounter: Payer: Self-pay | Admitting: Cardiology

## 2011-11-09 VITALS — BP 142/74 | HR 62 | Ht 62.0 in | Wt 128.8 lb

## 2011-11-09 DIAGNOSIS — E7800 Pure hypercholesterolemia, unspecified: Secondary | ICD-10-CM

## 2011-11-09 DIAGNOSIS — I2510 Atherosclerotic heart disease of native coronary artery without angina pectoris: Secondary | ICD-10-CM

## 2011-11-09 NOTE — Assessment & Plan Note (Signed)
She is at target.  

## 2011-11-09 NOTE — Patient Instructions (Signed)
Your physician has requested that you have an exercise tolerance test in 1-2 WEEKS. For further information please visit [email or web address] Please also follow instruction sheet, as given.  Your physician recommends that you continue on your current medications as directed. Please refer to the Current Medication list given to you today.

## 2011-11-09 NOTE — Progress Notes (Signed)
   HPI:  The patient is in for follow up.   She now has two jobs.  Overall doing ok.  However, she has noted for several months that one to two times per month she will note some brief episodes.  She has not been walking.  When she walks she does not notice it.  She does not take it.  No seizures since seen last.    Current Outpatient Prescriptions  Medication Sig Dispense Refill  . aspirin 81 MG tablet Take 81 mg by mouth daily.        . metoprolol tartrate (LOPRESSOR) 25 MG tablet Take one-half tablet by mouth twice a day  30 tablet  11  . nitroGLYCERIN (NITROSTAT) 0.4 MG SL tablet Place 0.4 mg under the tongue every 5 (five) minutes as needed.        Marland Kitchen PHENObarbital (LUMINAL) 30 MG tablet Take 30 mg by mouth 2 (two) times daily.        . rosuvastatin (CRESTOR) 20 MG tablet Take 1 tablet (20 mg total) by mouth daily.  30 tablet  11    No Known Allergies  Past Medical History  Diagnosis Date  . Myocardial infarction   . Seizure disorder     Past Surgical History  Procedure Date  . 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     Family History  Problem Relation Age of Onset  . Heart attack      History   Social History  . Marital Status: Married    Spouse Name: N/A    Number of Children: N/A  . Years of Education: N/A   Occupational History  . Not on file.   Social History Main Topics  . Smoking status: Former Games developer  . Smokeless tobacco: Never Used  . Alcohol Use: No  . Drug Use: No  . Sexually Active: Not on file   Other Topics Concern  . Not on file   Social History Narrative  . No narrative on file    ROS: Please see the HPI.  All other systems reviewed and negative.  PHYSICAL EXAM:  BP 142/74  Pulse 62  Ht 5\' 2"  (1.575 m)  Wt 128 lb 12.8 oz (58.423 kg)  BMI 23.56 kg/m2  General: Well developed, well nourished, in no acute distress. Head:  Normocephalic and  atraumatic. Neck: no JVD Lungs: Clear to auscultation and percussion. Heart: Normal S1 and S2.  No definite murmur.    Pulses: Pulses normal in all 4 extremities. Extremities: No clubbing or cyanosis. No edema. Neurologic: Alert and oriented x 3.  EKG:NSR.  Nonspecific T abnormality.  No change from tracing of September 2012.   ASSESSMENT AND PLAN:

## 2011-11-09 NOTE — Assessment & Plan Note (Signed)
She thinks the symptoms she experiences, which are brief, are related to lack of activity.  However, I have concern about progression of disease.  She would be a candidate for further intervention, although her RCA is really quite small and has been for a long time asymptomatic.  She has agreed to exercise testing to assess in the next couple of weeks.

## 2011-11-17 ENCOUNTER — Ambulatory Visit (INDEPENDENT_AMBULATORY_CARE_PROVIDER_SITE_OTHER): Payer: Medicare Other | Admitting: Cardiology

## 2011-11-17 ENCOUNTER — Encounter: Payer: Self-pay | Admitting: Cardiology

## 2011-11-17 DIAGNOSIS — I2510 Atherosclerotic heart disease of native coronary artery without angina pectoris: Secondary | ICD-10-CM

## 2011-11-17 NOTE — Procedures (Signed)
Exercise Treadmill Test  Pre-Exercise Testing Evaluation Rhythm: normal sinus  Rate: 73   PR:  .13 QRS:  .07  QT:  .36 QTc: .40     Test  Exercise Tolerance Test Ordering MD: Shawnie Pons, MD  Interpreting MD: Shawnie Pons, MD  Unique Test No: 1  Treadmill:  1  Indication for ETT: known ASHD  Contraindication to ETT: No   Stress Modality: exercise - treadmill  Cardiac Imaging Performed: non   Protocol: standard Bruce - maximal  Max BP:  177/86  Max MPHR (bpm):  154 85% MPR (bpm):  131  MPHR obtained (bpm):  162 % MPHR obtained:  105%  Reached 85% MPHR (min:sec):  0:58 Total Exercise Time (min-sec):  1:51  Workload in METS:  4.3 Borg Scale: 15  Reason ETT Terminated:  fatigue    ST Segment Analysis At Rest: normal ST segments - no evidence of significant ST depression With Exercise: non-specific ST changes  Other Information Arrhythmia:  No Angina during ETT:  absent (0) Quality of ETT:  diagnostic  ETT Interpretation:  borderline (indeterminate) with non-specific ST changes  Comments: Patient exercised today on the Bruce protocol.  ETT was limited due to deconditioned response.  No chest pain.  Borderline abnormal response with <45mm ST depression  Recommendations: Discussion regarding options.  I have carefully explained to her all the options.  She had a stented LAD with large wall motion abnormality and RCA lesion in a small vessel.  I am concerned because of her reduced LV function.  She is not convinced she wants another cath at this point.  I explained to her my concerns about the concern about the potential for another event.  If she has a change in status, she is to call me promptly.

## 2011-12-06 ENCOUNTER — Encounter: Payer: Medicare Other | Admitting: Physician Assistant

## 2012-01-03 ENCOUNTER — Ambulatory Visit: Payer: Medicare Other | Admitting: Cardiology

## 2012-01-25 ENCOUNTER — Ambulatory Visit: Payer: Medicare Other | Admitting: Cardiology

## 2012-02-15 ENCOUNTER — Encounter: Payer: Self-pay | Admitting: Cardiology

## 2012-02-15 ENCOUNTER — Ambulatory Visit (INDEPENDENT_AMBULATORY_CARE_PROVIDER_SITE_OTHER): Payer: Medicare Other | Admitting: Cardiology

## 2012-02-15 VITALS — BP 131/79 | HR 55 | Ht 62.0 in | Wt 131.2 lb

## 2012-02-15 DIAGNOSIS — I2510 Atherosclerotic heart disease of native coronary artery without angina pectoris: Secondary | ICD-10-CM

## 2012-02-15 DIAGNOSIS — E7800 Pure hypercholesterolemia, unspecified: Secondary | ICD-10-CM

## 2012-02-15 LAB — HEPATIC FUNCTION PANEL
Bilirubin, Direct: 0 mg/dL (ref 0.0–0.3)
Total Bilirubin: 0.4 mg/dL (ref 0.3–1.2)

## 2012-02-15 LAB — LIPID PANEL
HDL: 66.7 mg/dL (ref >39.00)
Total CHOL/HDL Ratio: 2
VLDL: 14.8 mg/dL (ref 0.0–40.0)

## 2012-02-15 NOTE — Patient Instructions (Signed)
Your physician recommends that you continue on your current medications as directed. Please refer to the Current Medication list given to you today.  Your physician recommends that you have lab work today: LIPID and LIVER  Your physician wants you to follow-up in: 6 MONTHS.  You will receive a reminder letter in the mail two months in advance. If you don't receive a letter, please call our office to schedule the follow-up appointment.

## 2012-02-15 NOTE — Progress Notes (Signed)
   HPI:  The patient returns in follow up. Overall she is doing well she's continued to work 2 jobs. She walked through a half miles last week and had no tightness in the chest or significant shortness of breath. However, she has given up on her regular walking, and we discussed this in some great detail today. She's had no new cardiac symptoms  Current Outpatient Prescriptions  Medication Sig Dispense Refill  . aspirin 81 MG tablet Take 81 mg by mouth daily.        . metoprolol tartrate (LOPRESSOR) 25 MG tablet Take one-half tablet by mouth twice a day  30 tablet  11  . nitroGLYCERIN (NITROSTAT) 0.4 MG SL tablet Place 0.4 mg under the tongue every 5 (five) minutes as needed.        Marland Kitchen PHENObarbital (LUMINAL) 30 MG tablet Take 30 mg by mouth 2 (two) times daily.        . rosuvastatin (CRESTOR) 20 MG tablet Take 1 tablet (20 mg total) by mouth daily.  30 tablet  11    No Known Allergies  Past Medical History  Diagnosis Date  . Myocardial infarction   . Seizure disorder     Past Surgical History  Procedure Date  . 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     Family History  Problem Relation Age of Onset  . Heart attack      History   Social History  . Marital Status: Married    Spouse Name: N/A    Number of Children: N/A  . Years of Education: N/A   Occupational History  . Not on file.   Social History Main Topics  . Smoking status: Former Games developer  . Smokeless tobacco: Never Used  . Alcohol Use: No  . Drug Use: No  . Sexually Active: Not on file   Other Topics Concern  . Not on file   Social History Narrative  . No narrative on file    ROS: Please see the HPI.  All other systems reviewed and negative.  PHYSICAL EXAM:  BP 131/79  Pulse 55  Ht 5\' 2"  (1.575 m)  Wt 131 lb 3.2 oz (59.512 kg)  BMI 24.00 kg/m2  General: Well developed, well nourished, in no acute  distress. Head:  Normocephalic and atraumatic. Neck: no JVD Lungs: Clear to auscultation and percussion. Heart: Normal S1 and S2.  No murmur, rubs or gallops.  Pulses: Pulses normal in all 4 extremities. Extremities: No clubbing or cyanosis. No edema. Neurologic: Alert and oriented x 3.  EKG:  NSR.  Septal MI, old.  Nonspecific T wave flattening.  No change from prior tracing.  ASSESSMENT AND PLAN:

## 2012-02-18 NOTE — Assessment & Plan Note (Signed)
She continues to remain asymptomatic.  I have encouraged her to resume her walking.  She is agreeable.

## 2012-02-18 NOTE — Assessment & Plan Note (Signed)
She needs a lipid and liver to assess her lipid status.

## 2012-04-05 ENCOUNTER — Other Ambulatory Visit: Payer: Self-pay | Admitting: Cardiology

## 2012-04-29 ENCOUNTER — Other Ambulatory Visit: Payer: Self-pay | Admitting: Cardiology

## 2012-04-30 ENCOUNTER — Telehealth: Payer: Self-pay | Admitting: *Deleted

## 2012-04-30 NOTE — Telephone Encounter (Signed)
t/w pt husband about plavix was requesting a new script d/c on 04/10/11 pt husband aware

## 2012-08-08 ENCOUNTER — Ambulatory Visit (HOSPITAL_COMMUNITY)
Admission: RE | Admit: 2012-08-08 | Discharge: 2012-08-08 | Disposition: A | Discharge status: Home / Self Care | Payer: Medicare Other | Source: Ambulatory Visit | Attending: Family Medicine | Admitting: Family Medicine

## 2012-08-08 ENCOUNTER — Other Ambulatory Visit (HOSPITAL_COMMUNITY): Payer: Self-pay | Admitting: Family Medicine

## 2012-08-08 DIAGNOSIS — Z139 Encounter for screening, unspecified: Secondary | ICD-10-CM

## 2012-08-08 DIAGNOSIS — Z1231 Encounter for screening mammogram for malignant neoplasm of breast: Secondary | ICD-10-CM | POA: Insufficient documentation

## 2012-08-08 DIAGNOSIS — J449 Chronic obstructive pulmonary disease, unspecified: Secondary | ICD-10-CM | POA: Insufficient documentation

## 2012-08-08 DIAGNOSIS — Z87891 Personal history of nicotine dependence: Secondary | ICD-10-CM

## 2012-08-08 DIAGNOSIS — J4489 Other specified chronic obstructive pulmonary disease: Secondary | ICD-10-CM | POA: Insufficient documentation

## 2012-09-02 ENCOUNTER — Encounter: Payer: Self-pay | Admitting: Cardiology

## 2012-09-02 ENCOUNTER — Ambulatory Visit (INDEPENDENT_AMBULATORY_CARE_PROVIDER_SITE_OTHER): Payer: Medicare Other | Admitting: Cardiology

## 2012-09-02 VITALS — BP 139/78 | HR 68 | Ht 62.0 in | Wt 134.0 lb

## 2012-09-02 DIAGNOSIS — I2510 Atherosclerotic heart disease of native coronary artery without angina pectoris: Secondary | ICD-10-CM

## 2012-09-02 DIAGNOSIS — Z87891 Personal history of nicotine dependence: Secondary | ICD-10-CM

## 2012-09-02 DIAGNOSIS — E7800 Pure hypercholesterolemia, unspecified: Secondary | ICD-10-CM

## 2012-09-02 DIAGNOSIS — I340 Nonrheumatic mitral (valve) insufficiency: Secondary | ICD-10-CM

## 2012-09-02 DIAGNOSIS — I059 Rheumatic mitral valve disease, unspecified: Secondary | ICD-10-CM

## 2012-09-02 NOTE — Progress Notes (Addendum)
   HPI:  Old. She continues to remain asymptomatic. Unfortunately, she says she is spending a bit too much time on the computer, and not exercising enough. He denies any ongoing chest pain. Prior to this, she was exercising on a fairly regular basis, and experienced no chest pain whatsoever. 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  She also said her cholesterol was checked by her primary care physician, Dr. Megan Mans, and better physical was okay.   Current Outpatient Prescriptions  Medication Sig Dispense Refill  . aspirin 81 MG tablet Take 81 mg by mouth daily.        . CRESTOR 20 MG tablet TAKE ONE TABLET BY MOUTH EVERY DAY  30 tablet  10  . metoprolol tartrate (LOPRESSOR) 25 MG tablet TAKE ONE-HALF TABLET BY MOUTH TWICE DAILY  30 tablet  10  . nitroGLYCERIN (NITROSTAT) 0.4 MG SL tablet Place 0.4 mg under the tongue every 5 (five) minutes as needed.        Marland Kitchen PHENObarbital (LUMINAL) 30 MG tablet Take 30 mg by mouth 2 (two) times daily.         No current facility-administered medications for this visit.    No Known Allergies  Past Medical History  Diagnosis Date  . Myocardial infarction   . Seizure disorder     Past Surgical History  Procedure Laterality Date  . 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      Family History  Problem Relation Age of Onset  . Heart attack      History   Social History  . Marital Status: Married    Spouse Name: N/A    Number of Children: N/A  . Years of Education: N/A   Occupational History  . Not on file.   Social History Main Topics  . Smoking status: Former Games developer  . Smokeless tobacco: Never Used  . Alcohol Use: No  . Drug Use: No  . Sexually Active: Not on file   Other Topics Concern  . Not on file   Social History Narrative  . No narrative on file     ROS: Please see the HPI.  All other systems reviewed and negative.  PHYSICAL EXAM:  BP 139/78  Pulse 68  Ht 5\' 2"  (1.575 m)  Wt 134 lb (60.782 kg)  BMI 24.5 kg/m2  SpO2 99%  General: Well developed, well nourished, in no acute distress. Head:  Normocephalic and atraumatic. Neck: no JVD Lungs: Clear to auscultation and percussion. Heart: Normal S1 and S2.  Not a prominent murmur.    Pulses: Pulses normal in all 4 extremities. Extremities: No clubbing or cyanosis. No edema. Neurologic: Alert and oriented x 3.  EKG:  NSR.  Anterior MI, indeterminate age.  T inversion, no change from prior tracing.    ASSESSMENT AND PLAN:

## 2012-09-02 NOTE — Patient Instructions (Addendum)
Your physician recommends that you schedule a follow-up appointment in: 3 MONTHS with Dr Diona Browner in the [facility] (previous pt of Dr Riley Kill)  Your physician recommends that you continue on your current medications as directed. Please refer to the Current Medication list given to you today.

## 2012-09-13 DIAGNOSIS — I340 Nonrheumatic mitral (valve) insufficiency: Secondary | ICD-10-CM | POA: Insufficient documentation

## 2012-09-13 NOTE — Assessment & Plan Note (Signed)
Has not resumed smoking.  TS

## 2012-09-13 NOTE — Addendum Note (Signed)
Addended by: Shawnie Pons D on: 09/13/2012 08:58 AM   Modules accepted: Level of Service

## 2012-09-13 NOTE — Assessment & Plan Note (Signed)
See results.  LDL is under good control on statin therapy.  GDMT.

## 2012-09-13 NOTE — Assessment & Plan Note (Addendum)
She has continued to remain stable on a medical regimen.  I have also encouraged her to walk on a regular basis as it gives [place] a better idea how she is doing, and stability as she is asymptomatic and has a collateralized LAD, with less than favorable RCA.  Continue medical therapy for now with follow up.  She did have a GXT last summer, and I had suggested a repeat cath study to her although she was not in favor.  She prefers limited testing.

## 2012-09-13 NOTE — Assessment & Plan Note (Signed)
She is really asymptomatic.  Would be appropriate to consider a repeat echocardiogram.  She had repeat cath after her last echocardiogram---and has been in favor of medical therapy.  If low EF, then consideration would be given to other treatment.

## 2012-10-10 ENCOUNTER — Telehealth: Payer: Self-pay | Admitting: Cardiology

## 2012-10-10 DIAGNOSIS — I2510 Atherosclerotic heart disease of native coronary artery without angina pectoris: Secondary | ICD-10-CM

## 2012-10-10 DIAGNOSIS — I059 Rheumatic mitral valve disease, unspecified: Secondary | ICD-10-CM

## 2012-10-10 NOTE — Telephone Encounter (Signed)
I called and spoke with patient.  She has been managed medically.  I asked her to return for an echo and she has agreed.  Will try to arrange in the next few weeks.  TS

## 2012-10-11 NOTE — Telephone Encounter (Signed)
Order placed for Echo. ° ° °

## 2012-10-11 NOTE — Telephone Encounter (Signed)
Echo scheduled on 10/16/12.

## 2012-10-16 ENCOUNTER — Ambulatory Visit (INDEPENDENT_AMBULATORY_CARE_PROVIDER_SITE_OTHER): Payer: Medicare Other | Admitting: Cardiology

## 2012-10-16 DIAGNOSIS — I059 Rheumatic mitral valve disease, unspecified: Secondary | ICD-10-CM

## 2012-10-16 DIAGNOSIS — I25810 Atherosclerosis of coronary artery bypass graft(s) without angina pectoris: Secondary | ICD-10-CM

## 2012-10-16 DIAGNOSIS — I2510 Atherosclerotic heart disease of native coronary artery without angina pectoris: Secondary | ICD-10-CM

## 2012-11-01 ENCOUNTER — Ambulatory Visit (INDEPENDENT_AMBULATORY_CARE_PROVIDER_SITE_OTHER): Payer: Medicare Other | Admitting: Cardiology

## 2012-11-01 ENCOUNTER — Encounter: Payer: Self-pay | Admitting: Cardiology

## 2012-11-01 VITALS — BP 120/78 | HR 65 | Ht 62.0 in | Wt 136.6 lb

## 2012-11-01 DIAGNOSIS — I2589 Other forms of chronic ischemic heart disease: Secondary | ICD-10-CM

## 2012-11-01 DIAGNOSIS — I2510 Atherosclerotic heart disease of native coronary artery without angina pectoris: Secondary | ICD-10-CM

## 2012-11-01 DIAGNOSIS — I255 Ischemic cardiomyopathy: Secondary | ICD-10-CM | POA: Insufficient documentation

## 2012-11-01 MED ORDER — LOSARTAN POTASSIUM 25 MG PO TABS: 25.0000 mg | ORAL_TABLET | Freq: Every day | ORAL | Status: DC

## 2012-11-01 NOTE — Patient Instructions (Addendum)
You have been referred to Dr Ladona Ridgel or Dr Graciela Husbands for consideration of ICD.  Your physician recommends that you return for lab work at EP appointment: BMP  Keep follow-up appointment with Dr Diona Browner.   DO NOT START LOSARTAN as initially instructed.

## 2012-11-01 NOTE — Assessment & Plan Note (Addendum)
The patient has been stable since the original presentation. She had anterior wall myocardial infarction, and had a reduced ejection fraction is documented by echocardiography. A repeat catheterization demonstrated a patent stent, and is significantly diseased small right caliber coronary artery. She's not had significant anginal symptoms. She's been walking regularly, and says she walks up to 3 miles a day. However, as measured by exercise tolerance testing a year ago she had very limited exercise tolerance at which time we recommended overall reevaluation. Because she was doing well, she preferred a conservative approach, with the meds she was taking at that time.  After additional discussion following a recent echocardiography, she is willing to visit with electrophysiologist for consideration of primary prevention.  If she has another attempt at ACE or ARB it would need to be in the setting of closely supervised care after she establishes in [place].  Marland Kitchen

## 2012-11-01 NOTE — Progress Notes (Signed)
HPI:  This very nice patient returns for followup. I asked her come in to be seen.  She is accompanied by her sister. The patient originally presented in August of 2011. She had an anterior wall myocardial infarction and was treated with primary angioplasty with stenting. A non-drug-eluting stent was placed. She had significant residual disease involving a small caliber right coronary artery. She also had reduced left ventricular function and some secondary mitral regurgitation, but her pulmonary pressures were only mildly elevated.  We discussed many options at that time, and she wanted a conservative course of management.   Of note, she did develop hyperkalemia on Lisinopril.   She had stopped smoking and was walking regularly, and was doing well.  Last year I performed an ETT on her and she was not able to complete three minutes.  I broached the idea with her of a repeat cath and more aggressive approach to management, but she preferred to hold off.   She has continued to do extremely well, and walked nearly 3 miles today without angina, or dyspnea.  However, her EF remains reduced on repeat echo  (EF 25-30%), and I brought her back in to again talk with her about ICD and more aggressive medical therapy.  She is a bit reluctant, and feels great.  Denies chest pain.  She does note some palpitations, mainly at night.    Current Outpatient Prescriptions  Medication Sig Dispense Refill  . aspirin 81 MG tablet Take 81 mg by mouth daily.        . CRESTOR 20 MG tablet TAKE ONE TABLET BY MOUTH EVERY DAY  30 tablet  10  . metoprolol tartrate (LOPRESSOR) 25 MG tablet TAKE ONE-HALF TABLET BY MOUTH TWICE DAILY  30 tablet  10  . nitroGLYCERIN (NITROSTAT) 0.4 MG SL tablet Place 0.4 mg under the tongue every 5 (five) minutes as needed.        Marland Kitchen PHENObarbital (LUMINAL) 30 MG tablet Take 30 mg by mouth 2 (two) times daily.        .       No current facility-administered medications for this visit.    No Known  Allergies  Past Medical History  Diagnosis Date  . Myocardial infarction   . Seizure disorder     Past Surgical History  Procedure Laterality Date  . 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      Family History  Problem Relation Age of Onset  . Heart attack      History   Social History  . Marital Status: Married    Spouse Name: N/A    Number of Children: N/A  . Years of Education: N/A   Occupational History  . Not on file.   Social History Main Topics  . Smoking status: Former Games developer  . Smokeless tobacco: Never Used  . Alcohol Use: No  . Drug Use: No  . Sexually Active: Not on file   Other Topics Concern  . Not on file   Social History Narrative  . No narrative on file    ROS: Please see the HPI.  All other systems reviewed and negative.  PHYSICAL EXAM:  BP 120/78  Pulse 65  Ht 5\' 2"  (1.575 m)  Wt 136 lb 9.6 oz (61.961 kg)  BMI 24.98 kg/m2  SpO2 98%  General: Well developed, well nourished, in no acute distress. Head:  Normocephalic and  atraumatic. Neck: no JVD Lungs: Clear to auscultation and percussion. Heart: Normal S1 and S2.  I do not appreciate a significant mitral murmur.   Pulses: Pulses normal in all 4 extremities. Extremities: No clubbing or cyanosis. No edema. Neurologic: Alert and oriented x 3.  EKG:  ASSESSMENT AND PLAN:  Had a very long discussion today about potential options, and I discussed this with both the patient and her sister. Given her long-standing reduced ejection fraction, documented on echocardiography 3 years apart, I suggested again that she re-consider an implantable defibrillator. Because she has felt well, she's been very reluctant to consider this, but after a thorough discussion today she was willing to have a monitor placed, initiation of angiotensin receptor blockade, and a visit with electrophysiologist.  We will hold off on  an ACE or ARB until she has follow up if even then--she developed recurrent hyperkalemia on Lisinopril. Marland Kitchen  She will see our EP physicians in the first available slot, and we will move toward medical optimization during that period.  She will consider all of this tentatively.

## 2012-11-29 ENCOUNTER — Ambulatory Visit (INDEPENDENT_AMBULATORY_CARE_PROVIDER_SITE_OTHER): Payer: Medicare Other | Admitting: Internal Medicine

## 2012-11-29 ENCOUNTER — Other Ambulatory Visit: Payer: Medicare Other

## 2012-11-29 ENCOUNTER — Encounter: Payer: Self-pay | Admitting: Internal Medicine

## 2012-11-29 VITALS — BP 116/60 | HR 73 | Ht 62.0 in | Wt 133.0 lb

## 2012-11-29 DIAGNOSIS — E875 Hyperkalemia: Secondary | ICD-10-CM

## 2012-11-29 DIAGNOSIS — I255 Ischemic cardiomyopathy: Secondary | ICD-10-CM

## 2012-11-29 DIAGNOSIS — I2589 Other forms of chronic ischemic heart disease: Secondary | ICD-10-CM

## 2012-11-29 DIAGNOSIS — Z8639 Personal history of other endocrine, nutritional and metabolic disease: Secondary | ICD-10-CM | POA: Insufficient documentation

## 2012-11-29 LAB — SEDIMENTATION RATE: Sed Rate: 12 mm/hr (ref 0–22)

## 2012-11-29 MED ORDER — CARVEDILOL 3.125 MG PO TABS: 3.1250 mg | ORAL_TABLET | Freq: Two times a day (BID) | ORAL | Status: DC

## 2012-11-29 NOTE — Assessment & Plan Note (Signed)
The patient has long-standing ischemic cardiomyopathy.I wonder however, whether her medical regime could be  Changed whether thereby we could see an improvement in left ventricular function. To that end we will discontinue her Lopressor and begin her on carvedilol at 3.125 twice daily. We'll have her come back and see Tereso Newcomer in about 4 weeks. If her blood pressure at that time is okay we will begin her on hydralazine nitrates with a plan to reassess left ventricular function about 6-8 weeks thereafter in for me to see her shortly after that to decide about defibrillator.  We discussed the potential role of the defibrillator and its relationship to left ventricular function

## 2012-11-29 NOTE — Progress Notes (Signed)
ELECTROPHYSIOLOGY CONSULT NOTE  Patient ID: Destiny Hoffman, MRN: [id number], DOB/AGE: 12-23-1945 67 y.o. Admit date: (Not on file) Date of Consult: 11/29/2012  Primary Physician: Alice Reichert, MD Primary Cardiologist: TDS  Chief Complaint:  ICD    HPI Destiny Hoffman is a 67 y.o. female  Of seen for consideration of ICD implantation. She has a history of myocardial infarction with inferior wall MI August 2011 treated with primary PCI. She received a non-drug-eluting stent.    Ejection fraction remains impaired and 25%, this by echo 4/14.she is on beta blocker therapy with metoprolol tartrate;she developed hyperkalemia on ACE inhibitors precluding their use.  She has modest exercise intolerance. She is short of breath climbing a flight of stairs; she has occasional edema But denies nocturnal dyspnea or orthopnea.  Past Medical History  Diagnosis Date  . Myocardial infarction   . Seizure disorder       Surgical History:  Past Surgical History  Procedure Laterality Date  . 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       Home Meds: Prior to Admission medications   Medication Sig Start Date End Date Taking? Authorizing Provider  aspirin 81 MG tablet Take 81 mg by mouth daily.     Yes Historical Provider, MD  CRESTOR 20 MG tablet TAKE ONE TABLET BY MOUTH EVERY DAY 04/05/12  Yes Herby Abraham, MD  metoprolol tartrate (LOPRESSOR) 25 MG tablet TAKE ONE-HALF TABLET BY MOUTH TWICE DAILY 04/05/12  Yes Herby Abraham, MD  nitroGLYCERIN (NITROSTAT) 0.4 MG SL tablet Place 0.4 mg under the tongue every 5 (five) minutes as needed.     Yes Historical Provider, MD  PHENObarbital (LUMINAL) 30 MG tablet Take 30 mg by mouth 2 (two) times daily.     Yes Historical Provider, MD       Allergies: No Known Allergies  History   Social History  . Marital Status: Married    Spouse Name: N/A   Number of Children: N/A  . Years of Education: N/A   Occupational History  . Not on file.   Social History Main Topics  . Smoking status: Former Games developer  . Smokeless tobacco: Never Used  . Alcohol Use: No  . Drug Use: No  . Sexually Active: Not on file   Other Topics Concern  . Not on file   Social History Narrative  . No narrative on file     Family History  Problem Relation Age of Onset  . Heart attack       ROS:  Please see the history of present illness.     All other systems reviewed and negative.    Physical Exam: Blood pressure 116/60, pulse 73, height 5\' 2"  (1.575 m), weight 133 lb (60.328 kg), SpO2 96.00%. General: Well developed, well nourished female in no acute distress. Head: Normocephalic, atraumatic, sclera non-icteric, no xanthomas, nares are without discharge. EENT: normal Lymph Nodes:  none Back: without scoliosis/kyphosis  no CVA tendersness Neck: Negative for carotid bruits. JVD not elevated. Lungs: Clear bilaterally to auscultation without wheezes, rales, or rhonchi. Breathing is unlabored. Heart: RRR with S1 S2.  2/6 systolic murmur , wit displaced PMI Abdomen: Soft, non-tender, non-distended with normoactive bowel sounds. No hepatomegaly. No rebound/guarding. No obvious abdominal masses. Msk:  Strength and tone appear normal for age. Extremities: No clubbing or cyanosis. No edema.  Distal pedal pulses are 2+ and equal bilaterally.  Skin: Warm and Dry Neuro: Alert and oriented X 3. CN III-XII intact Grossly normal sensory and motor function . Psych:  Responds to questions appropriately with a normal affect.      Labs: Cardiac Enzymes No results found for this basename: CKTOTAL, CKMB, TROPONINI,  in the last 72 hours CBC Lab Results  Component Value Date   WBC 7.9 05/20/2010   HGB 11.9* 05/20/2010   HCT 37.1 05/20/2010   MCV 96.1 05/20/2010   PLT 186 05/20/2010   PROTIME: No results found for this basename: LABPROT, INR,  in the last 72  hours Chemistry No results found for this basename: NA, K, CL, CO2, BUN, CREATININE, CALCIUM, LABALBU, PROT, BILITOT, ALKPHOS, ALT, AST, GLUCOSE,  in the last 168 hours Lipids Lab Results  Component Value Date   CHOL 119 02/15/2012   HDL 66.70 02/15/2012   LDLCALC 38 02/15/2012   TRIG 74.0 02/15/2012     Radiology/Studies:  No results found.  EKG: NSR 66 with narrow QRS  Assessment and Plan:   Sherryl Manges

## 2012-11-29 NOTE — Patient Instructions (Addendum)
STOP Metoprolol  START: Coreg ( Carvedilol) 3.125mg   1 tablet twice a day  You will need to have lab work today: sed rate  This if for your shoulder pain  In 4 weeks Dr. Graciela Husbands would like you to see Dr. Diona Browner   In 10 weeks you will need to have and ECHO done here in our office.  Your physician has requested that you have an echocardiogram. Echocardiography is a painless test that uses sound waves to create images of your heart. It provides your doctor with information about the size and shape of your heart and how well your heart's chambers and valves are working. This procedure takes approximately one hour. There are no restrictions for this procedure.  In 12 weeks follow-up office visit with Dr.Klein

## 2012-12-05 ENCOUNTER — Ambulatory Visit: Payer: Medicare Other | Admitting: Cardiology

## 2013-01-21 ENCOUNTER — Encounter: Payer: Self-pay | Admitting: Cardiology

## 2013-01-21 ENCOUNTER — Ambulatory Visit (INDEPENDENT_AMBULATORY_CARE_PROVIDER_SITE_OTHER): Payer: Medicare Other | Admitting: Cardiology

## 2013-01-21 VITALS — BP 120/73 | HR 65 | Ht 62.0 in | Wt 132.4 lb

## 2013-01-21 DIAGNOSIS — I2510 Atherosclerotic heart disease of native coronary artery without angina pectoris: Secondary | ICD-10-CM

## 2013-01-21 DIAGNOSIS — I255 Ischemic cardiomyopathy: Secondary | ICD-10-CM

## 2013-01-21 DIAGNOSIS — I2589 Other forms of chronic ischemic heart disease: Secondary | ICD-10-CM

## 2013-01-21 DIAGNOSIS — E782 Mixed hyperlipidemia: Secondary | ICD-10-CM

## 2013-01-21 NOTE — Progress Notes (Signed)
Clinical Summary Destiny Hoffman is a 67 y.o.female presenting for an office visit. She is a former patient of Dr. Riley Kill, most recently seen by Dr. Graciela Husbands. Records reviewed. Patient has been managed conservatively with medical therapy, a discussion was had about possible ICD in May following further optimization of medical regimen and ultimately a followup echocardiogram.  Echocardiogram from April of this year demonstrated normal LV chamber size with mild LVH, LVEF 25-30%, sclerotic aortic valve, mild mitral regurgitation, mild tricuspid regurgitation, elevated CVP.  Destiny Hoffman and I had a long discussion today about her cardiac status. It is not clear to me that she has a good understanding of her medical condition. She does tell me that she has felt better in general sense being on Coreg. She has not followed up for her echocardiogram as yet. Today we discussed the rationale for further optimizing medical treatment for the management of cardiomyopathy. She was not inclined to add any additional medications today. She also indicated that she was not enthusiastic about pursuing an ICD, although had not made a final decision.  Allergies  Allergen Reactions  . Ace Inhibitors     Current Outpatient Prescriptions  Medication Sig Dispense Refill  . aspirin 81 MG tablet Take 81 mg by mouth daily.        . carvedilol (COREG) 3.125 MG tablet Take 1 tablet (3.125 mg total) by mouth 2 (two) times daily.  180 tablet  3  . CRESTOR 20 MG tablet TAKE ONE TABLET BY MOUTH EVERY DAY  30 tablet  10  . nitroGLYCERIN (NITROSTAT) 0.4 MG SL tablet Place 0.4 mg under the tongue every 5 (five) minutes as needed.        Marland Kitchen PHENObarbital (LUMINAL) 30 MG tablet Take 30 mg by mouth 2 (two) times daily.         No current facility-administered medications for this visit.    Past Medical History  Diagnosis Date  . Myocardial infarction, anterior wall 2011  . Seizure disorder   . Ischemic cardiomyopathy     LVEF  25-30% echo 2014  . Hyperkalemia on ACE inhibitors   . Coronary atherosclerosis of native coronary artery     BMS to LAD 2011, residual RCA disease managed medically    Social History Destiny Hoffman reports that she has quit smoking. Her smoking use included Cigarettes. She smoked 0.00 packs per day. She has never used smokeless tobacco. Destiny Hoffman reports that she does not drink alcohol.  Review of Systems Reports fewer palpitations. No dizziness or syncope. No claudication. No orthopnea or PND. Otherwise negative.  Physical Examination Filed Vitals:   01/21/13 1448  BP: 120/73  Pulse: 65   Filed Weights   01/21/13 1448  Weight: 132 lb 6.4 oz (60.056 kg)   Comfortable at rest. HEENT: Conjunctiva and lids normal, oropharynx clear. Neck: Supple, no elevated JVP or carotid bruits, no thyromegaly. Lungs: Clear to auscultation, nonlabored breathing at rest. Cardiac: Regular rate and rhythm, no S3, soft systolic murmur, no pericardial rub. Abdomen: Soft, nontender, bowel sounds present, no guarding or rebound. Extremities: No pitting edema, distal pulses 2+. Skin: Warm and dry. Musculoskeletal: No kyphosis. Neuropsychiatric: Alert and oriented x3, affect grossly appropriate.   Problem List and Plan   Cardiomyopathy, ischemic LVEF 25-30% as of echocardiogram in April of this year. Patient is tolerating Coreg. Prior history noted of hyperkalemia on ACE inhibitor. I did discuss with Ms. Laske rationale for further optimizing medical treatment for cardiomyopathy, perhaps considering combination nitrate and  hydralazine as a next step (would be less enthusiastic about attempting an ARB or Aldactone in light of her history of hyperkalemia). She was not inclined to start any additional medications at this point however. She is scheduled to see Dr. Graciela Husbands in August, will try to see if we can get an echocardiogram done on the morning of that visit, although I would be surprised if there has  been a marked improvement in her LVEF on Coreg alone. She has not yet made a final decision about ICD, but seems to be leaning against proceeding.  Coronary atherosclerosis of native coronary artery History of BMS the LAD in 2011 with medically managed RCA disease. Patient denies any active angina symptoms.  Mixed hyperlipidemia Continues on Crestor, followed by Dr. Renard Matter.    Destiny Hoffman, M.D., F.A.C.C.

## 2013-01-21 NOTE — Assessment & Plan Note (Signed)
LVEF 25-30% as of echocardiogram in April of this year. Patient is tolerating Coreg. Prior history noted of hyperkalemia on ACE inhibitor. I did discuss with Destiny Hoffman rationale for further optimizing medical treatment for cardiomyopathy, perhaps considering combination nitrate and hydralazine as a next step (would be less enthusiastic about attempting an ARB or Aldactone in light of her history of hyperkalemia). She was not inclined to start any additional medications at this point however. She is scheduled to see Dr. Graciela Husbands in August, will try to see if we can get an echocardiogram done on the morning of that visit, although I would be surprised if there has been a marked improvement in her LVEF on Coreg alone. She has not yet made a final decision about ICD, but seems to be leaning against proceeding.

## 2013-01-21 NOTE — Patient Instructions (Signed)
   Echo - to be done in [place] at [facility] office on same day as visit with Dr. Graciela Husbands Continue all current medications. Follow up in  3 months

## 2013-01-21 NOTE — Assessment & Plan Note (Signed)
History of BMS the LAD in 2011 with medically managed RCA disease. Patient denies any active angina symptoms.

## 2013-01-21 NOTE — Assessment & Plan Note (Signed)
Continues on Crestor, followed by Dr. McInnis. 

## 2013-02-07 ENCOUNTER — Other Ambulatory Visit (HOSPITAL_COMMUNITY): Payer: Medicare Other

## 2013-02-24 ENCOUNTER — Ambulatory Visit (INDEPENDENT_AMBULATORY_CARE_PROVIDER_SITE_OTHER): Payer: Medicare Other | Admitting: Internal Medicine

## 2013-02-24 ENCOUNTER — Ambulatory Visit (HOSPITAL_COMMUNITY): Payer: Medicare Other | Attending: Internal Medicine | Admitting: Radiology

## 2013-02-24 ENCOUNTER — Encounter: Payer: Self-pay | Admitting: Internal Medicine

## 2013-02-24 VITALS — BP 154/87 | HR 69 | Ht 62.0 in | Wt 135.8 lb

## 2013-02-24 DIAGNOSIS — D692 Other nonthrombocytopenic purpura: Secondary | ICD-10-CM | POA: Insufficient documentation

## 2013-02-24 DIAGNOSIS — I2589 Other forms of chronic ischemic heart disease: Secondary | ICD-10-CM | POA: Insufficient documentation

## 2013-02-24 DIAGNOSIS — E875 Hyperkalemia: Secondary | ICD-10-CM

## 2013-02-24 DIAGNOSIS — I252 Old myocardial infarction: Secondary | ICD-10-CM | POA: Insufficient documentation

## 2013-02-24 DIAGNOSIS — I2510 Atherosclerotic heart disease of native coronary artery without angina pectoris: Secondary | ICD-10-CM | POA: Insufficient documentation

## 2013-02-24 DIAGNOSIS — E785 Hyperlipidemia, unspecified: Secondary | ICD-10-CM | POA: Insufficient documentation

## 2013-02-24 DIAGNOSIS — I255 Ischemic cardiomyopathy: Secondary | ICD-10-CM

## 2013-02-24 MED ORDER — CARVEDILOL 6.25 MG PO TABS: 6.2500 mg | ORAL_TABLET | Freq: Two times a day (BID) | ORAL | Status: DC

## 2013-02-24 NOTE — Progress Notes (Signed)
Patient Care Team: Alice Reichert, MD as PCP - General (Family Medicine)   HPI  Destiny Hoffman is a 67 y.o. female Seen in followup for discussions regarding ICD implantation.his history of ischemic cardiomyopathy with an ejection fraction 25-30%. When she was seen in May we undertook a change in her medication regime to put her on carvedilol with consideration of use of hydralazine/nitrates in hopes that her left ventricular function would improve.   Assessment August/14 demonstrated EF of 35-40%. There is moderate MR.  She has seen Dr. Diona Browner in the interim was had extensive conversations with her regarding the scope of her illness, treatment options and potential role of an ICD.  Past Medical History  Diagnosis Date  . Myocardial infarction, anterior wall 2011  . Seizure disorder   . Ischemic cardiomyopathy     LVEF 25-30% echo 2014  . Hyperkalemia on ACE inhibitors   . Coronary atherosclerosis of native coronary artery     BMS to LAD 2011, residual RCA disease managed medically    No past surgical history on file.  Current Outpatient Prescriptions  Medication Sig Dispense Refill  . aspirin 81 MG tablet Take 81 mg by mouth daily.        . carvedilol (COREG) 3.125 MG tablet Take 1 tablet (3.125 mg total) by mouth 2 (two) times daily.  180 tablet  3  . CRESTOR 20 MG tablet TAKE ONE TABLET BY MOUTH EVERY DAY  30 tablet  10  . nitroGLYCERIN (NITROSTAT) 0.4 MG SL tablet Place 0.4 mg under the tongue every 5 (five) minutes as needed.        Marland Kitchen PHENObarbital (LUMINAL) 30 MG tablet Take 30 mg by mouth 2 (two) times daily.         No current facility-administered medications for this visit.    Allergies  Allergen Reactions  . Ace Inhibitors     Review of Systems negative except from HPI and PMH  Physical Exam BP 154/87  Pulse 69  Ht 5\' 2"  (1.575 m)  Wt 135 lb 12.8 oz (61.598 kg)  BMI 24.83 kg/m2 Well developed and nourished in no acute distress HENT normal Neck  supple with JVP-flat Clear Regular rate and rhythm, no murmurs or gallops Abd-soft with active BS No Clubbing cyanosis edema Skin-warm and dry   She has series of purpuric lesions on   foot which she relates to bug bites A & Oriented  Grossly normal sensory and motor function  ECG   demonstrates sinus rhythm with prior septal MI  Assessment and  Plan

## 2013-02-24 NOTE — Progress Notes (Signed)
Echocardiogram performed.  

## 2013-02-24 NOTE — Assessment & Plan Note (Signed)
There has been interval improvement in her left ventricular function. We will further uptitrate her carvedilol 3-6.25 mg twice daily. He will follow up with Dr. Dionicia Abler in October as previously scheduled. We will be available to see her when necessary

## 2013-02-24 NOTE — Assessment & Plan Note (Signed)
She thinks it relates to bug bites I've asked her to followup with her PCP in the event that they don't resolve.

## 2013-02-24 NOTE — Patient Instructions (Addendum)
Your physician recommends that you schedule a follow-up appointment in: AS NEEDED  INCREASE CARVEDILOL TO 6.25MG  TWICE DAILY

## 2013-03-05 ENCOUNTER — Other Ambulatory Visit: Payer: Self-pay | Admitting: Cardiology

## 2013-03-19 ENCOUNTER — Other Ambulatory Visit: Payer: Self-pay | Admitting: *Deleted

## 2013-03-19 DIAGNOSIS — I255 Ischemic cardiomyopathy: Secondary | ICD-10-CM

## 2013-04-21 ENCOUNTER — Ambulatory Visit: Payer: Medicare Other | Admitting: Cardiology

## 2013-04-22 ENCOUNTER — Telehealth: Payer: Self-pay | Admitting: Internal Medicine

## 2013-04-23 ENCOUNTER — Encounter: Payer: Self-pay | Admitting: Cardiology

## 2013-04-23 ENCOUNTER — Ambulatory Visit (INDEPENDENT_AMBULATORY_CARE_PROVIDER_SITE_OTHER): Payer: Medicare Other | Admitting: Cardiology

## 2013-04-23 VITALS — BP 122/69 | HR 67 | Ht 62.0 in | Wt 133.0 lb

## 2013-04-23 DIAGNOSIS — I2510 Atherosclerotic heart disease of native coronary artery without angina pectoris: Secondary | ICD-10-CM

## 2013-04-23 DIAGNOSIS — I255 Ischemic cardiomyopathy: Secondary | ICD-10-CM

## 2013-04-23 DIAGNOSIS — I2589 Other forms of chronic ischemic heart disease: Secondary | ICD-10-CM

## 2013-04-23 DIAGNOSIS — E782 Mixed hyperlipidemia: Secondary | ICD-10-CM

## 2013-04-23 NOTE — Progress Notes (Signed)
Clinical Summary Destiny Hoffman is a 67 y.o.female last seen in July, a former patient of Dr. Riley Kill. Interval visit noted with Dr. Graciela Husbands in August. She had an echocardiogram around that time that showed some improvement in LVEF to the range of 35-40% associated with wall motion abnormalities, mild aortic regurgitation, moderate mitral regurgitation, and PASP 43 mm mercury. I reviewed this with her today.  Patient states that she has been feeling "fine." She has been walking with NYHA class II dyspnea, no chest pain, no palpitations or syncope. She states that she has been taking her medications as directed. She is however still reluctant to add any further medications to her current regimen. Weight is down 2 pounds from the last visit.  She reiterated the fact that she is not of a mind to pursue an ICD at this particular time.   Allergies  Allergen Reactions  . Ace Inhibitors     Current Outpatient Prescriptions  Medication Sig Dispense Refill  . aspirin 81 MG tablet Take 81 mg by mouth daily.        . carvedilol (COREG) 6.25 MG tablet Take 1 tablet (6.25 mg total) by mouth 2 (two) times daily.  180 tablet  3  . CRESTOR 20 MG tablet TAKE ONE TABLET BY MOUTH EVERY DAY  30 tablet  6  . nitroGLYCERIN (NITROSTAT) 0.4 MG SL tablet Place 0.4 mg under the tongue every 5 (five) minutes as needed.        Marland Kitchen PHENObarbital (LUMINAL) 30 MG tablet Take 30 mg by mouth 2 (two) times daily.         No current facility-administered medications for this visit.    Past Medical History  Diagnosis Date  . Myocardial infarction, anterior wall 2011  . Seizure disorder   . Ischemic cardiomyopathy     LVEF 25-30% echo 2014  . Hyperkalemia on ACE inhibitors   . Coronary atherosclerosis of native coronary artery     BMS to LAD 2011, residual RCA disease managed medically    Social History Ms. Herbold reports that she has quit smoking. Her smoking use included Cigarettes. She smoked 0.00 packs per day.  She has never used smokeless tobacco. Ms. Whitehorn reports that she does not drink alcohol.  Review of Systems No orthopnea or PND, no claudication. Otherwise negative.  Physical Examination Filed Vitals:   04/23/13 1045  BP: 122/69  Pulse: 67   Filed Weights   04/23/13 1045  Weight: 133 lb (60.328 kg)    Comfortable at rest.  HEENT: Conjunctiva and lids normal, oropharynx clear.  Neck: Supple, no elevated JVP or carotid bruits, no thyromegaly.  Lungs: Clear to auscultation, nonlabored breathing at rest.  Cardiac: Regular rate and rhythm, no S3, soft systolic murmur, no pericardial rub.  Abdomen: Soft, nontender, bowel sounds present, no guarding or rebound.  Extremities: No pitting edema, distal pulses 2+.  Skin: Warm and dry.  Musculoskeletal: No kyphosis.  Neuropsychiatric: Alert and oriented x3, affect grossly appropriate.   Problem List and Plan   Cardiomyopathy, ischemic Most recent echocardiogram shows LVEF improved the range of 35-40%. She is taking Coreg now at 6.25 mg twice daily, however remains reluctant to add any additional medications. As noted previously, can always consider low-dose hydralazine/nitrate combination next. She remains disinclined to pursue further discussions about ICD at this point. We will continue observation.  Coronary atherosclerosis of native coronary artery No active angina symptoms.  Mixed hyperlipidemia She continues on Crestor, followed by Dr. Renard Matter.  Satira Sark, M.D., F.A.C.C.

## 2013-04-23 NOTE — Assessment & Plan Note (Signed)
She continues on Crestor, followed by Dr. Renard Matter.

## 2013-04-23 NOTE — Assessment & Plan Note (Signed)
No active angina symptoms. 

## 2013-04-23 NOTE — Assessment & Plan Note (Signed)
Most recent echocardiogram shows LVEF improved the range of 35-40%. She is taking Coreg now at 6.25 mg twice daily, however remains reluctant to add any additional medications. As noted previously, can always consider low-dose hydralazine/nitrate combination next. She remains disinclined to pursue further discussions about ICD at this point. We will continue observation.

## 2013-04-23 NOTE — Patient Instructions (Signed)
Your physician recommends that you schedule a follow-up appointment in: 3 months. You will receive a reminder letter in the mail in about 1-2 months reminding you to call and schedule your appointment. If you don't receive this letter, please contact our office. Your physician recommends that you continue on your current medications as directed. Please refer to the Current Medication list given to you today. [phone] is the number you can call to reach Dr. Odessa Fleming nurse.

## 2013-10-01 ENCOUNTER — Other Ambulatory Visit: Payer: Self-pay | Admitting: Internal Medicine

## 2013-10-01 ENCOUNTER — Other Ambulatory Visit: Payer: Self-pay | Admitting: *Deleted

## 2013-10-01 MED ORDER — ROSUVASTATIN CALCIUM 20 MG PO TABS: 20.0000 mg | ORAL_TABLET | Freq: Every day | ORAL | Status: DC

## 2013-10-16 ENCOUNTER — Other Ambulatory Visit: Payer: Self-pay | Admitting: Cardiology

## 2013-10-16 ENCOUNTER — Telehealth: Payer: Self-pay

## 2013-10-16 MED ORDER — ROSUVASTATIN CALCIUM 20 MG PO TABS: 20.0000 mg | ORAL_TABLET | Freq: Every day | ORAL | Status: DC

## 2013-10-16 NOTE — Telephone Encounter (Signed)
rosuvastatin (CRESTOR) 20 MG tablet  [facility] in [place]   Completely out of medication

## 2013-11-13 ENCOUNTER — Ambulatory Visit (INDEPENDENT_AMBULATORY_CARE_PROVIDER_SITE_OTHER): Payer: Medicare Other | Admitting: Cardiology

## 2013-11-13 ENCOUNTER — Encounter: Payer: Self-pay | Admitting: Cardiology

## 2013-11-13 VITALS — BP 122/76 | HR 69 | Ht 62.0 in | Wt 137.0 lb

## 2013-11-13 DIAGNOSIS — I2510 Atherosclerotic heart disease of native coronary artery without angina pectoris: Secondary | ICD-10-CM

## 2013-11-13 DIAGNOSIS — I255 Ischemic cardiomyopathy: Secondary | ICD-10-CM

## 2013-11-13 DIAGNOSIS — E782 Mixed hyperlipidemia: Secondary | ICD-10-CM

## 2013-11-13 DIAGNOSIS — I2589 Other forms of chronic ischemic heart disease: Secondary | ICD-10-CM

## 2013-11-13 NOTE — Assessment & Plan Note (Signed)
LVEF most recently in the range of 35-40%. She is taking Coreg now at 6.25 mg twice daily. She remains reluctant to add any additional medications. ICD therapy has been discussed, and she has declined. We will continue observation. I encouraged her to pursue a basic exercise regimen as tolerated.

## 2013-11-13 NOTE — Assessment & Plan Note (Signed)
Recommended that she followup with Dr. Renard MatterMcInnis for physical and reassessment of lipids.

## 2013-11-13 NOTE — Patient Instructions (Signed)
Continue all current medications. Your physician wants you to follow up in: 6 months.  You will receive a reminder letter in the mail one-two months in advance.  If you don't receive a letter, please call our office to schedule the follow up appointment   

## 2013-11-13 NOTE — Progress Notes (Signed)
Clinical Summary [name] is a 68 y.o.female last seen in October 2014, a former patient of Dr. Riley Hoffman. At the last visit she was clinically stable, this remains the case today. She has preferred fairly limited medical therapy over time. We discussed her medications today. She states that she is due for a physical with lab work with Dr. Renard Hoffman soon. She has not had recent lipid assessment.  She has a history of ischemic cardiomyopathy, LVEF most recently in the range of 35-40%. She has preferred medical therapy and has declined ICD.  She reports NYHA class II dyspnea, no chest pain or palpitations, no syncope. She works one day a week at a [employer]. Tells me that she is interested in joining a senior center for both social activities and exercise.   Allergies  Allergen Reactions  . Ace Inhibitors     Current Outpatient Prescriptions  Medication Sig Dispense Refill  . aspirin 81 MG tablet Take 81 mg by mouth daily.        . carvedilol (COREG) 6.25 MG tablet Take 1 tablet (6.25 mg total) by mouth 2 (two) times daily.  180 tablet  3  . nitroGLYCERIN (NITROSTAT) 0.4 MG SL tablet Place 0.4 mg under the tongue every 5 (five) minutes as needed.        Marland Kitchen. PHENObarbital (LUMINAL) 30 MG tablet Take 30 mg by mouth 2 (two) times daily.        . rosuvastatin (CRESTOR) 20 MG tablet Take 1 tablet (20 mg total) by mouth daily.  30 tablet  6   No current facility-administered medications for this visit.    Past Medical History  Diagnosis Date  . Myocardial infarction, anterior wall 2011  . Seizure disorder   . Ischemic cardiomyopathy     LVEF 25-30% echo 2014  . Hyperkalemia on ACE inhibitors   . Coronary atherosclerosis of native coronary artery     BMS to LAD 2011, residual RCA disease managed medically    Social History [name] reports that she has quit smoking. Her smoking use included Cigarettes. She smoked 0.00 packs per day. She has never used smokeless tobacco. [name]  reports that she does not drink alcohol.  Review of Systems Occasional cramps in her hands and feet, gets better with mustard. Otherwise negative except as outlined.  Physical Examination Filed Vitals:   11/13/13 1341  BP: 122/76  Pulse: 69   Filed Weights   11/13/13 1341  Weight: 137 lb (62.143 kg)    Comfortable at rest.  HEENT: Conjunctiva and lids normal, oropharynx clear.  Neck: Supple, no elevated JVP or carotid bruits, no thyromegaly.  Lungs: Clear to auscultation, nonlabored breathing at rest.  Cardiac: Regular rate and rhythm, no S3, soft systolic murmur, no pericardial rub.  Abdomen: Soft, nontender, bowel sounds present, no guarding or rebound.  Extremities: No pitting edema, distal pulses 2+.  Skin: Warm and dry.  Musculoskeletal: No kyphosis.  Neuropsychiatric: Alert and oriented x3, affect grossly appropriate.   Problem List and Plan   Cardiomyopathy, ischemic LVEF most recently in the range of 35-40%. She is taking Coreg now at 6.25 mg twice daily. She remains reluctant to add any additional medications. ICD therapy has been discussed, and she has declined. We will continue observation. I encouraged her to pursue a basic exercise regimen as tolerated.  Coronary atherosclerosis of native coronary artery No active angina symptoms. She continues on aspirin and statin.  Mixed hyperlipidemia Recommended that she followup with Dr.  McInnis for physical and reassessment of lipids.    Jonelle SidleSamuel G. McDowell, M.D., F.A.C.C.

## 2013-11-13 NOTE — Assessment & Plan Note (Signed)
No active angina symptoms. She continues on aspirin and statin.

## 2013-11-17 ENCOUNTER — Telehealth: Payer: Self-pay | Admitting: Cardiology

## 2013-11-17 NOTE — Telephone Encounter (Signed)
Needs refill on rosuvastatin (CRESTOR) 20 MG tablet   [facility] [place], [place]

## 2013-11-17 NOTE — Telephone Encounter (Signed)
Spoke with pharmacy and they said this rx is there on profile and patient must call them directly to refill. Attempted to call patient but no answer.

## 2014-01-06 ENCOUNTER — Other Ambulatory Visit (HOSPITAL_COMMUNITY): Payer: Self-pay | Admitting: Family Medicine

## 2014-01-06 DIAGNOSIS — Z1231 Encounter for screening mammogram for malignant neoplasm of breast: Secondary | ICD-10-CM

## 2014-01-06 DIAGNOSIS — M810 Age-related osteoporosis without current pathological fracture: Secondary | ICD-10-CM

## 2014-01-12 ENCOUNTER — Other Ambulatory Visit (HOSPITAL_COMMUNITY): Payer: Medicare Other

## 2014-01-12 ENCOUNTER — Ambulatory Visit (HOSPITAL_COMMUNITY): Payer: Medicare Other

## 2014-01-13 ENCOUNTER — Ambulatory Visit (HOSPITAL_COMMUNITY)
Admission: RE | Admit: 2014-01-13 | Discharge: 2014-01-13 | Disposition: A | Discharge status: Home / Self Care | Payer: Medicare Other | Source: Ambulatory Visit | Attending: Family Medicine | Admitting: Family Medicine

## 2014-01-13 DIAGNOSIS — Z1231 Encounter for screening mammogram for malignant neoplasm of breast: Secondary | ICD-10-CM | POA: Insufficient documentation

## 2014-02-24 ENCOUNTER — Other Ambulatory Visit: Payer: Self-pay | Admitting: Internal Medicine

## 2014-04-06 ENCOUNTER — Ambulatory Visit (INDEPENDENT_AMBULATORY_CARE_PROVIDER_SITE_OTHER): Payer: Medicare Other | Admitting: Cardiology

## 2014-04-06 ENCOUNTER — Encounter: Payer: Self-pay | Admitting: Cardiology

## 2014-04-06 VITALS — BP 122/71 | HR 73 | Ht 62.0 in | Wt 137.8 lb

## 2014-04-06 DIAGNOSIS — I2510 Atherosclerotic heart disease of native coronary artery without angina pectoris: Secondary | ICD-10-CM

## 2014-04-06 DIAGNOSIS — I2589 Other forms of chronic ischemic heart disease: Secondary | ICD-10-CM

## 2014-04-06 DIAGNOSIS — I255 Ischemic cardiomyopathy: Secondary | ICD-10-CM

## 2014-04-06 DIAGNOSIS — E782 Mixed hyperlipidemia: Secondary | ICD-10-CM

## 2014-04-06 NOTE — Assessment & Plan Note (Signed)
No active angina symptoms at this time. ECG reviewed and stable. She has just joined a senior citizen exercise program, I encouraged her to keep an eye out for any progressive symptomatology that might prompt interval ischemic testing. Otherwise plan to see her back in 6 months.

## 2014-04-06 NOTE — Assessment & Plan Note (Signed)
LVEF 35-40%, she has declined ICD.

## 2014-04-06 NOTE — Patient Instructions (Signed)

## 2014-04-06 NOTE — Assessment & Plan Note (Signed)
Recent LDL 48, on Crestor.

## 2014-04-06 NOTE — Progress Notes (Signed)
    Clinical Summary Destiny Hoffman is a 68 y.o.female last seen in April. She presents for a routine visit. Tells me that her sister passed away suddenly in 2023/03/21. She is still dealing with this, has joined a senior citizen exercise group and is trying to stay involved with others. She reports no angina symptoms, no nitroglycerin use.  Lab work from June showed hemoglobin 12, platelets 173, potassium 4.6, BUN 18, grade 0.8, normal LFTs, cholesterol 114, triglycerides 82, HDL 50, LDL 48, TSH 1.1.  She has a history of ischemic cardiomyopathy, LVEF most recently in the range of 35-40%. She has preferred medical therapy and has declined ICD.  ECG shows sinus rhythm with chronic anteroseptal ST-T wave abnormalities.   Allergies  Allergen Reactions  . Ace Inhibitors     Current Outpatient Prescriptions  Medication Sig Dispense Refill  . aspirin 81 MG tablet Take 81 mg by mouth daily.        . carvedilol (COREG) 6.25 MG tablet TAKE ONE TABLET BY MOUTH TWICE DAILY  180 tablet  0  . nitroGLYCERIN (NITROSTAT) 0.4 MG SL tablet Place 0.4 mg under the tongue every 5 (five) minutes as needed.        Marland Kitchen PHENObarbital (LUMINAL) 30 MG tablet Take 30 mg by mouth 2 (two) times daily.        . rosuvastatin (CRESTOR) 20 MG tablet Take 1 tablet (20 mg total) by mouth daily.  30 tablet  6   No current facility-administered medications for this visit.    Past Medical History  Diagnosis Date  . Myocardial infarction, anterior wall 2011  . Seizure disorder   . Ischemic cardiomyopathy     LVEF 25-30% echo 2014  . Hyperkalemia on ACE inhibitors   . Coronary atherosclerosis of native coronary artery     BMS to LAD 2011, residual RCA disease managed medically    Social History Destiny Hoffman reports that she quit smoking about 3 years ago. Her smoking use included Cigarettes. She smoked 0.00 packs per day. She has never used smokeless tobacco. Destiny Hoffman reports that she does not drink alcohol.  Review of  Systems No palpitations or syncope. No claudication. Other systems reviewed and negative except as outlined.  Physical Examination Filed Vitals:   04/06/14 1507  BP: 122/71  Pulse: 73   Filed Weights   04/06/14 1507  Weight: 137 lb 12.8 oz (62.506 kg)    Comfortable at rest.  HEENT: Conjunctiva and lids normal, oropharynx clear.  Neck: Supple, no elevated JVP or carotid bruits, no thyromegaly.  Lungs: Clear to auscultation, nonlabored breathing at rest.  Cardiac: Regular rate and rhythm, no S3, soft systolic murmur, no pericardial rub.  Abdomen: Soft, nontender, bowel sounds present, no guarding or rebound.  Extremities: No pitting edema, distal pulses 2+.  Skin: Warm and dry.  Musculoskeletal: No kyphosis.  Neuropsychiatric: Alert and oriented x3, affect grossly appropriate.   Problem List and Plan   Coronary atherosclerosis of native coronary artery No active angina symptoms at this time. ECG reviewed and stable. She has just joined a senior citizen exercise program, I encouraged her to keep an eye out for any progressive symptomatology that might prompt interval ischemic testing. Otherwise plan to see her back in 6 months.  Cardiomyopathy, ischemic LVEF 35-40%, she has declined ICD.  Mixed hyperlipidemia Recent LDL 48, on Crestor.    Jonelle Sidle, M.D., F.A.C.C.

## 2014-05-18 ENCOUNTER — Other Ambulatory Visit: Payer: Self-pay | Admitting: Internal Medicine

## 2014-06-22 ENCOUNTER — Other Ambulatory Visit: Payer: Self-pay | Admitting: *Deleted

## 2014-06-22 MED ORDER — ROSUVASTATIN CALCIUM 20 MG PO TABS: 20.0000 mg | ORAL_TABLET | Freq: Every day | ORAL | Status: DC

## 2014-06-22 NOTE — Telephone Encounter (Signed)
Crestor refilled.

## 2014-11-24 ENCOUNTER — Ambulatory Visit (INDEPENDENT_AMBULATORY_CARE_PROVIDER_SITE_OTHER): Payer: Medicare Other | Admitting: Cardiology

## 2014-11-24 ENCOUNTER — Encounter: Payer: Self-pay | Admitting: Cardiology

## 2014-11-24 VITALS — BP 111/72 | HR 62 | Ht 62.0 in | Wt 139.1 lb

## 2014-11-24 DIAGNOSIS — E782 Mixed hyperlipidemia: Secondary | ICD-10-CM

## 2014-11-24 DIAGNOSIS — I255 Ischemic cardiomyopathy: Secondary | ICD-10-CM | POA: Diagnosis not present

## 2014-11-24 NOTE — Progress Notes (Signed)
Cardiology Office Note  Date: 11/24/2014   ID: Destiny KanskyShirley A Buelow, DOB 1945/09/04, MRN [id number]  PCP: Alice ReichertMCINNIS,ANGUS G, MD  Primary Cardiologist: Nona DellSamuel Mckinsey Keagle, MD   Chief Complaint  Patient presents with  . Coronary Artery Disease  . Cardiomyopathy    History of Present Illness: Destiny Hoffman is a 69 y.o. female last seen in September 2015. She presents for a routine follow-up visit. From a cardiac perspective, she reports no angina, has stable NYHA class II dyspnea. She reports compliance with her medications which are reviewed below.  She has a history of ischemic cardiomyopathy, LVEF most recently in the range of 35-40%. She has preferred medical therapy and has declined ICD. She denies any palpitations or sudden syncope.  She reports walking some for exercise. Has also joined an Veterinary surgeonart painting class.  She states that she needs to schedule a follow-up with Dr. Renard MatterMcInnis for physical and lab work. I encouraged her to do so.   Past Medical History  Diagnosis Date  . Myocardial infarction, anterior wall 2011  . Seizure disorder   . Ischemic cardiomyopathy     LVEF 25-30% echo 2014  . Hyperkalemia on ACE inhibitors   . Coronary atherosclerosis of native coronary artery     BMS to LAD 2011, residual RCA disease managed medically     Current Outpatient Prescriptions  Medication Sig Dispense Refill  . aspirin 81 MG tablet Take 81 mg by mouth daily.      . carvedilol (COREG) 6.25 MG tablet TAKE ONE TABLET BY MOUTH TWICE DAILY 180 tablet 2  . nitroGLYCERIN (NITROSTAT) 0.4 MG SL tablet Place 0.4 mg under the tongue every 5 (five) minutes as needed.      Marland Kitchen. PHENObarbital (LUMINAL) 30 MG tablet Take 30 mg by mouth 2 (two) times daily.      . rosuvastatin (CRESTOR) 20 MG tablet Take 1 tablet (20 mg total) by mouth daily. 30 tablet 6   No current facility-administered medications for this visit.    Allergies:  Ace inhibitors   Social History: The patient  reports that she  quit smoking about 4 years ago. Her smoking use included Cigarettes. She has never used smokeless tobacco. She reports that she does not drink alcohol or use illicit drugs.   ROS:  Please see the history of present illness. Otherwise, complete review of systems is positive for none.  All other systems are reviewed and negative.   Physical Exam: VS:  BP 111/72 mmHg  Pulse 62  Ht 5\' 2"  (1.575 m)  Wt 139 lb 1.9 oz (63.104 kg)  BMI 25.44 kg/m2  SpO2 98%, BMI Body mass index is 25.44 kg/(m^2).  Wt Readings from Last 3 Encounters:  11/24/14 139 lb 1.9 oz (63.104 kg)  04/06/14 137 lb 12.8 oz (62.506 kg)  11/13/13 137 lb (62.143 kg)     Comfortable at rest.  HEENT: Conjunctiva and lids normal, oropharynx clear.  Neck: Supple, no elevated JVP or carotid bruits, no thyromegaly.  Lungs: Clear to auscultation, nonlabored breathing at rest.  Cardiac: Regular rate and rhythm, no S3, soft systolic murmur, no pericardial rub.  Abdomen: Soft, nontender, bowel sounds present, no guarding or rebound.  Extremities: No pitting edema, distal pulses 2+.  Skin: Warm and dry.  Musculoskeletal: No kyphosis.  Neuropsychiatric: Alert and oriented x3, affect grossly appropriate.   ECG: ECG is not ordered today.   Recent Labwork:  01/07/2014: Hemoglobin 12.0, platelets 173 , potassium 4.6, BUN 18, creatinine 0.8, AST 15,  ALT 10, cholesterol 114, triglycerides 82, HDL 50, LDL 48 , TSH 1.08  Other Studies Reviewed Today:  Echocardiogram 02/24/2013: Study Conclusions  - Left ventricle: LVEF is approximately 35 to 40% with akinesis of the mid/distal inferoseptal, apical, distal inferior; hypokinesis of the inferior, mid/distal lateral and mid/distal anterior walls The cavity size was mildly dilated. - Aortic valve: Mild regurgitation. - Mitral valve: Moderate regurgitation. - Pulmonary arteries: PA peak pressure: 43mm Hg (S).   Assessment and Plan:  1. Ischemic cardiomyopathy with  LVEF 35-40%. She reports no angina symptoms on present medical regimen, and has declined ICD. She has been somewhat hesitant to further modify medical therapy. No active heart failure symptoms. We will continue medical therapy and observation.  2. Hyperlipidemia, on statin therapy. I encouraged her to follow-up with Dr. Renard MatterMcInnis for physical and lab work.  Current medicines were reviewed with the patient today.   Disposition: FU with me in 6 months.   Signed, Jonelle SidleSamuel G. Jamieka Royle, MD, [facility] 11/24/2014 2:41 PM    [facility] [address], [place], [place] [address] Phone: [phone]; Fax: [phone]

## 2014-11-24 NOTE — Patient Instructions (Signed)
Continue all current medications. Your physician wants you to follow up in: 6 months.  You will receive a reminder letter in the mail one-two months in advance.  If you don't receive a letter, please call our office to schedule the follow up appointment   

## 2015-01-25 ENCOUNTER — Other Ambulatory Visit: Payer: Self-pay | Admitting: *Deleted

## 2015-01-25 ENCOUNTER — Other Ambulatory Visit: Payer: Self-pay | Admitting: Internal Medicine

## 2015-01-25 MED ORDER — ROSUVASTATIN CALCIUM 20 MG PO TABS: 20.0000 mg | ORAL_TABLET | Freq: Every day | ORAL | Status: AC

## 2015-03-26 ENCOUNTER — Other Ambulatory Visit (HOSPITAL_COMMUNITY): Payer: Self-pay | Admitting: Family Medicine

## 2015-03-26 DIAGNOSIS — M810 Age-related osteoporosis without current pathological fracture: Secondary | ICD-10-CM

## 2015-03-26 DIAGNOSIS — Z1231 Encounter for screening mammogram for malignant neoplasm of breast: Secondary | ICD-10-CM

## 2015-04-01 ENCOUNTER — Ambulatory Visit (HOSPITAL_COMMUNITY): Payer: Medicare Other

## 2015-04-02 ENCOUNTER — Ambulatory Visit (HOSPITAL_COMMUNITY)
Admission: RE | Admit: 2015-04-02 | Discharge: 2015-04-02 | Disposition: A | Discharge status: Home / Self Care | Payer: Medicare Other | Source: Ambulatory Visit | Attending: Family Medicine | Admitting: Family Medicine

## 2015-04-02 DIAGNOSIS — Z780 Asymptomatic menopausal state: Secondary | ICD-10-CM | POA: Diagnosis not present

## 2015-04-02 DIAGNOSIS — M810 Age-related osteoporosis without current pathological fracture: Secondary | ICD-10-CM | POA: Insufficient documentation

## 2015-04-02 DIAGNOSIS — R29890 Loss of height: Secondary | ICD-10-CM | POA: Insufficient documentation

## 2015-04-02 DIAGNOSIS — Z1231 Encounter for screening mammogram for malignant neoplasm of breast: Secondary | ICD-10-CM | POA: Diagnosis present

## 2015-04-06 ENCOUNTER — Other Ambulatory Visit: Payer: Self-pay | Admitting: Family Medicine

## 2015-04-06 DIAGNOSIS — R928 Other abnormal and inconclusive findings on diagnostic imaging of breast: Secondary | ICD-10-CM

## 2015-04-20 ENCOUNTER — Encounter (HOSPITAL_COMMUNITY): Payer: Medicare Other

## 2015-04-27 ENCOUNTER — Ambulatory Visit (HOSPITAL_COMMUNITY)
Admission: RE | Admit: 2015-04-27 | Discharge: 2015-04-27 | Disposition: A | Discharge status: Home / Self Care | Payer: Medicare Other | Source: Ambulatory Visit | Attending: Family Medicine | Admitting: Family Medicine

## 2015-04-27 DIAGNOSIS — R928 Other abnormal and inconclusive findings on diagnostic imaging of breast: Secondary | ICD-10-CM | POA: Diagnosis present

## 2015-08-27 DIAGNOSIS — M810 Age-related osteoporosis without current pathological fracture: Secondary | ICD-10-CM | POA: Diagnosis not present

## 2015-08-27 DIAGNOSIS — Z955 Presence of coronary angioplasty implant and graft: Secondary | ICD-10-CM | POA: Diagnosis not present

## 2015-08-27 DIAGNOSIS — R569 Unspecified convulsions: Secondary | ICD-10-CM | POA: Diagnosis not present

## 2015-11-10 ENCOUNTER — Ambulatory Visit (INDEPENDENT_AMBULATORY_CARE_PROVIDER_SITE_OTHER): Payer: Medicare Other | Admitting: Cardiology

## 2015-11-10 ENCOUNTER — Encounter: Payer: Self-pay | Admitting: Cardiology

## 2015-11-10 VITALS — BP 110/70 | HR 68 | Ht 62.0 in | Wt 130.0 lb

## 2015-11-10 DIAGNOSIS — I255 Ischemic cardiomyopathy: Secondary | ICD-10-CM | POA: Diagnosis not present

## 2015-11-10 DIAGNOSIS — I2510 Atherosclerotic heart disease of native coronary artery without angina pectoris: Secondary | ICD-10-CM

## 2015-11-10 DIAGNOSIS — E782 Mixed hyperlipidemia: Secondary | ICD-10-CM | POA: Diagnosis not present

## 2015-11-10 NOTE — Patient Instructions (Signed)

## 2015-11-10 NOTE — Progress Notes (Signed)
Cardiology Office Note  Date: 11/10/2015   ID: JENNEL MARA, DOB 1945/08/24, MRN [id number]  PCP: Pearson Grippe, MD  Primary Cardiologist: Nona Dell, MD   Chief Complaint  Patient presents with  . Coronary Artery Disease  . Cardiomyopathy    History of Present Illness: Destiny Hoffman is a 70 y.o. female last seen in May 2016. She presents for a follow-up visit. She has now established with Dr. Selena Batten for primary care. In terms of stamina, she reports that she has been feeling well, participating in the senior games, also painting. She reports NYHA class II dyspnea, no chest pain, no palpitations or syncope.  She has a history of ischemic cardiomyopathy, LVEF in the range of 35-40% as of 2014. She has preferred medical therapy and has declined ICD.   I reviewed her medications. She is on aspirin, Coreg, and Crestor. She has not required any nitroglycerin. ECG reviewed today shows sinus rhythm with prolonged PR interval and low voltage.   Past Medical History  Diagnosis Date  . Myocardial infarction, anterior wall (HCC) 2011  . Seizure disorder (HCC)   . Ischemic cardiomyopathy     LVEF 25-30% echo 2014  . Hyperkalemia on ACE inhibitors   . Coronary atherosclerosis of native coronary artery     BMS to LAD 2011, residual RCA disease managed medically    History reviewed. No pertinent past surgical history.  Current Outpatient Prescriptions  Medication Sig Dispense Refill  . aspirin 81 MG tablet Take 81 mg by mouth daily.      . carvedilol (COREG) 6.25 MG tablet TAKE ONE TABLET BY MOUTH TWICE DAILY 180 tablet 2  . nitroGLYCERIN (NITROSTAT) 0.4 MG SL tablet Place 0.4 mg under the tongue every 5 (five) minutes as needed.      Marland Kitchen PHENObarbital (LUMINAL) 30 MG tablet Take 30 mg by mouth 2 (two) times daily.      . rosuvastatin (CRESTOR) 20 MG tablet Take 1 tablet (20 mg total) by mouth daily. 30 tablet 6   No current facility-administered medications for this visit.    Allergies:  Ace inhibitors   Social History: The patient  reports that she quit smoking about 5 years ago. Her smoking use included Cigarettes. She has never used smokeless tobacco. She reports that she does not drink alcohol or use illicit drugs.   ROS:  Please see the history of present illness. Otherwise, complete review of systems is positive for arthritis.  All other systems are reviewed and negative.   Physical Exam: VS:  BP 110/70 mmHg  Pulse 68  Ht  (1.575 m)  Wt 130 lb (58.968 kg)  BMI 23.77 kg/m2  SpO2 98%, BMI Body mass index is 23.77 kg/(m^2).  Wt Readings from Last 3 Encounters:  11/10/15 130 lb (58.968 kg)  11/24/14 139 lb 1.9 oz (63.104 kg)  04/06/14 137 lb 12.8 oz (62.506 kg)    Comfortable at rest.  HEENT: Conjunctiva and lids normal, oropharynx clear.  Neck: Supple, no elevated JVP or carotid bruits, no thyromegaly.  Lungs: Clear to auscultation, nonlabored breathing at rest.  Cardiac: Regular rate and rhythm, no S3, soft systolic murmur, no pericardial rub.  Abdomen: Soft, nontender, bowel sounds present, no guarding or rebound.  Extremities: No pitting edema, distal pulses 2+.  Skin: Warm and dry.  Musculoskeletal: No kyphosis.  Neuropsychiatric: Alert and oriented x3, affect grossly appropriate.  ECG: I personally reviewed the prior tracing from 04/06/2014 which showed sinus rhythm with possible old  anterior infarct pattern.  Recent Labwork:  June 2015: Hemoglobin 12.0, platelets 173, potassium 4.6, BUN 18, creatinine 0.8, AST 15, ALT 10, cholesterol 114, triglycerides 82, HDL 50, LDL 48, TSH 1.1  Other Studies Reviewed Today:  Echocardiogram 02/24/2013: Study Conclusions  - Left ventricle: LVEF is approximately 35 to 40% with akinesis of the mid/distal inferoseptal, apical, distal inferior; hypokinesis of the inferior, mid/distal lateral and mid/distal anterior walls The cavity size was mildly dilated. - Aortic valve: Mild  regurgitation. - Mitral valve: Moderate regurgitation. - Pulmonary arteries: PA peak pressure: 43mm Hg (S).  Assessment and Plan:  1. History of ischemic cardiomyopathy, LVEF 35-40% as of 2014. We discussed obtaining a follow-up echocardiogram for repeat assessment. Ultimately, plan is to continue medical therapy and conservative management, she has declined ICD.  2. Hyperlipidemia, on Crestor. LDL has been well controlled in the past. Follow-up lab work is pending per Dr. Selena BattenKim.  3. CAD status post BMS to the LAD in 2011 with residual RCA disease is being managed medically. She reports no active angina symptoms, we will continue observation for now.  4. History of hyperkalemia on Ace inhibitors.  Current medicines were reviewed with the patient today.   Orders Placed This Encounter  Procedures  . EKG 12-Lead    Disposition: FU with me in 6 months.   Signed, Jonelle SidleSamuel G. Kourtney Montesinos, MD, [facility] 11/10/2015 11:41 AM    [facility] [address], [place], [place] [address] Phone: [phone]; Fax: [phone]

## 2015-11-17 ENCOUNTER — Other Ambulatory Visit: Payer: Self-pay

## 2015-11-17 ENCOUNTER — Ambulatory Visit (INDEPENDENT_AMBULATORY_CARE_PROVIDER_SITE_OTHER): Payer: Medicare Other

## 2015-11-17 DIAGNOSIS — I255 Ischemic cardiomyopathy: Secondary | ICD-10-CM

## 2015-11-19 ENCOUNTER — Telehealth: Payer: Self-pay | Admitting: *Deleted

## 2015-11-19 DIAGNOSIS — I2510 Atherosclerotic heart disease of native coronary artery without angina pectoris: Secondary | ICD-10-CM | POA: Diagnosis not present

## 2015-11-19 DIAGNOSIS — I10 Essential (primary) hypertension: Secondary | ICD-10-CM | POA: Diagnosis not present

## 2015-11-19 DIAGNOSIS — M545 Low back pain: Secondary | ICD-10-CM | POA: Diagnosis not present

## 2015-11-19 DIAGNOSIS — I351 Nonrheumatic aortic (valve) insufficiency: Secondary | ICD-10-CM | POA: Diagnosis not present

## 2015-11-19 DIAGNOSIS — I428 Other cardiomyopathies: Secondary | ICD-10-CM | POA: Diagnosis not present

## 2015-11-19 NOTE — Telephone Encounter (Signed)
Patient's husband informed

## 2015-11-19 NOTE — Telephone Encounter (Signed)
-----   Message from Jonelle SidleSamuel G McDowell, MD sent at 11/17/2015  4:42 PM EDT ----- Reviewed report. Relatively stable LVEF around 35%. No significant change on medical therapy, we will continue with current plan as per recent note.

## 2016-01-05 DIAGNOSIS — I10 Essential (primary) hypertension: Secondary | ICD-10-CM | POA: Diagnosis not present

## 2016-01-05 DIAGNOSIS — I428 Other cardiomyopathies: Secondary | ICD-10-CM | POA: Diagnosis not present

## 2016-06-26 NOTE — Progress Notes (Signed)
Cardiology Office Note  Date: 06/27/2016   ID: Destiny KanskyShirley A Sobh, DOB [birth year], MRN [id number]  PCP: Pearson GrippeJames Kim, MD  Primary Cardiologist: Nona DellSamuel Erina Hamme, MD   Chief Complaint  Patient presents with  . Cardiomyopathy    History of Present Illness: Destiny Hoffman is a 70 y.o. female last seen in April. She presents for a routine follow-up visit. She does not report any chest pain or palpitations, describes NYHA class II dyspnea with that we'll activities. She is not exercising at this time, we did discuss trying to stick with a regular walking planned.  She has been drinking pomegranate juice for its antioxidant properties. States that she has read about potential health benefits.  Follow-up echocardiogram is outlined below. LVEF is in the range of 30-35%. She has declined ICD. We have continued medical therapy which now includes ASA, Coreg and Crestor. She has an ACE inhibitor allergy. Blood pressure is elevated today. No evidence of volume overload.  Past Medical History:  Diagnosis Date  . Coronary atherosclerosis of native coronary artery    BMS to LAD 2011, residual RCA disease managed medically  . Hyperkalemia on ACE inhibitors   . Ischemic cardiomyopathy    LVEF 25-30% echo 2014  . Myocardial infarction, anterior wall (HCC) 2011  . Seizure disorder [facility](HCC)     History reviewed. No pertinent surgical history.  Current Outpatient Prescriptions  Medication Sig Dispense Refill  . aspirin 81 MG tablet Take 81 mg by mouth daily.      . carvedilol (COREG) 6.25 MG tablet TAKE ONE TABLET BY MOUTH TWICE DAILY 180 tablet 2  . nitroGLYCERIN (NITROSTAT) 0.4 MG SL tablet Place 0.4 mg under the tongue every 5 (five) minutes as needed.      Marland Kitchen. PHENObarbital (LUMINAL) 30 MG tablet Take 30 mg by mouth 2 (two) times daily.      . Pomegranate, Punica granatum, (POMEGRANATE PO) Take by mouth. Juice - drinking occasionally    . rosuvastatin (CRESTOR) 20 MG tablet Take 1 tablet (20 mg  total) by mouth daily. 30 tablet 6   No current facility-administered medications for this visit.    Allergies:  Ace inhibitors   Social History: The patient  reports that she quit smoking about 5 years ago. Her smoking use included Cigarettes. She has never used smokeless tobacco. She reports that she does not drink alcohol or use drugs.   ROS:  Please see the history of present illness. Otherwise, complete review of systems is positive for none.  All other systems are reviewed and negative.   Physical Exam: VS:  BP (!) 145/91   Pulse 67   Ht 5\' 2"  (1.575 m)   Wt 139 lb 12.8 oz (63.4 kg)   SpO2 97%   BMI 25.57 kg/m , BMI Body mass index is 25.57 kg/m.  Wt Readings from Last 3 Encounters:  06/27/16 139 lb 12.8 oz (63.4 kg)  11/10/15 130 lb (59 kg)  11/24/14 139 lb 1.9 oz (63.1 kg)    Comfortable at rest.  HEENT: Conjunctiva and lids normal, oropharynx clear.  Neck: Supple, no elevated JVP or carotid bruits, no thyromegaly.  Lungs: Clear to auscultation, nonlabored breathing at rest.  Cardiac: Regular rate and rhythm, no S3, soft systolic murmur, no pericardial rub.  Abdomen: Soft, nontender, bowel sounds present, no guarding or rebound.  Extremities: No pitting edema, distal pulses 2+.  Skin: Warm and dry.  Musculoskeletal: No kyphosis.  Neuropsychiatric: Alert and oriented x3, affect grossly appropriate.  ECG: I personally reviewed the tracing from 11/10/2015 which showed sinus rhythm with low voltage and anteroseptal Q waves.  Recent Labwork:  June 2015: Hemoglobin 12.0, platelets 173, potassium 4.6, BUN 18, creatinine 0.8, AST 15, ALT 10, cholesterol 114, triglycerides 82, HDL 50, LDL 48, TSH 1.1  Other Studies Reviewed Today:  Echocardiogram 11/17/2015: Study Conclusions  - Left ventricle: The cavity size was normal. Wall thickness was   normal. Systolic function was moderately to severely reduced. The   estimated ejection fraction was in the range of 30%  to 35%.   Technically difficult assessment of LV function, consider limited   study with echocontrast to better evaluate. Doppler parameters   are consistent with abnormal left ventricular relaxation (grade 1   diastolic dysfunction). - Regional wall motion abnormality: Akinesis of the basal-mid   anteroseptal myocardium. Cannot distinguish additional focal wall   motion abnormalities, endocardium poorly visualized. - Aortic valve: Mildly calcified annulus. Normal thickness   leaflets. There was mild regurgitation. Valve area (VTI): 1.93   cm^2. Valve area (Vmax): 1.73 cm^2. Valve area (Vmean): 2.08   cm^2. - Mitral valve: Mildly calcified annulus. Normal thickness leaflets   . - Left atrium: The atrium was moderately dilated. - Right atrium: The atrium was mildly dilated. - Tricuspid valve: There was mild-moderate regurgitation. - Technically difficult study. Consider limited echo with   echocontrast to better evaluate LVEF and wall motion.  Assessment and Plan:  1. CAD status post BMS the LAD in 2011 with residual RCA disease that is being managed medically. She does not report any angina on current regimen.  2. Ischemic cardiomyopathy with LVEF 30-35%. No evidence of volume overload on examination today. Continue Coreg. Reports ACE inhibitor allergy (it sounds like she actually had hyperkalemia) and has declined ICD as well. Could consider combination hydralazine/Imdur, although she has been somewhat resistant to medications in general.  3. Hyperlipidemia, continue his on Crestor. Requesting most recent lab work from Dr. Selena BattenKim.  Current medicines were reviewed with the patient today.  Disposition: Follow-up in 6 months, sooner if needed.  Signed, Jonelle SidleSamuel G. Ayla Dunigan, MD, [facility]FACC 06/27/2016 4:27 PM    [facility] [address], [place], [place] [address] Phone: [phone]; Fax: [phone]

## 2016-06-27 ENCOUNTER — Encounter: Payer: Self-pay | Admitting: Cardiology

## 2016-06-27 ENCOUNTER — Encounter: Payer: Self-pay | Admitting: *Deleted

## 2016-06-27 ENCOUNTER — Ambulatory Visit (INDEPENDENT_AMBULATORY_CARE_PROVIDER_SITE_OTHER): Payer: Medicare Other | Admitting: Cardiology

## 2016-06-27 VITALS — BP 145/91 | HR 67 | Ht 62.0 in | Wt 139.8 lb

## 2016-06-27 DIAGNOSIS — I2510 Atherosclerotic heart disease of native coronary artery without angina pectoris: Secondary | ICD-10-CM

## 2016-06-27 DIAGNOSIS — E782 Mixed hyperlipidemia: Secondary | ICD-10-CM

## 2016-06-27 DIAGNOSIS — I255 Ischemic cardiomyopathy: Secondary | ICD-10-CM | POA: Diagnosis not present

## 2016-06-27 NOTE — Patient Instructions (Signed)

## 2016-07-27 DIAGNOSIS — M545 Low back pain: Secondary | ICD-10-CM | POA: Diagnosis not present

## 2016-07-27 DIAGNOSIS — G4089 Other seizures: Secondary | ICD-10-CM | POA: Diagnosis not present

## 2016-07-27 DIAGNOSIS — I2510 Atherosclerotic heart disease of native coronary artery without angina pectoris: Secondary | ICD-10-CM | POA: Diagnosis not present

## 2016-07-27 DIAGNOSIS — R4189 Other symptoms and signs involving cognitive functions and awareness: Secondary | ICD-10-CM | POA: Diagnosis not present

## 2017-01-22 DIAGNOSIS — G4089 Other seizures: Secondary | ICD-10-CM | POA: Diagnosis not present

## 2017-01-23 DIAGNOSIS — I2510 Atherosclerotic heart disease of native coronary artery without angina pectoris: Secondary | ICD-10-CM | POA: Diagnosis not present

## 2017-01-23 DIAGNOSIS — G4089 Other seizures: Secondary | ICD-10-CM | POA: Diagnosis not present

## 2017-01-23 DIAGNOSIS — I428 Other cardiomyopathies: Secondary | ICD-10-CM | POA: Diagnosis not present

## 2017-01-23 DIAGNOSIS — E785 Hyperlipidemia, unspecified: Secondary | ICD-10-CM | POA: Diagnosis not present

## 2017-03-27 DIAGNOSIS — G4089 Other seizures: Secondary | ICD-10-CM | POA: Diagnosis not present

## 2017-03-27 DIAGNOSIS — I2510 Atherosclerotic heart disease of native coronary artery without angina pectoris: Secondary | ICD-10-CM | POA: Diagnosis not present

## 2017-03-27 DIAGNOSIS — E785 Hyperlipidemia, unspecified: Secondary | ICD-10-CM | POA: Diagnosis not present

## 2017-04-03 DIAGNOSIS — I10 Essential (primary) hypertension: Secondary | ICD-10-CM | POA: Diagnosis not present

## 2017-04-03 DIAGNOSIS — G4089 Other seizures: Secondary | ICD-10-CM | POA: Diagnosis not present

## 2017-04-03 DIAGNOSIS — E785 Hyperlipidemia, unspecified: Secondary | ICD-10-CM | POA: Diagnosis not present

## 2017-04-03 DIAGNOSIS — I2510 Atherosclerotic heart disease of native coronary artery without angina pectoris: Secondary | ICD-10-CM | POA: Diagnosis not present

## 2017-08-03 DIAGNOSIS — I10 Essential (primary) hypertension: Secondary | ICD-10-CM | POA: Diagnosis not present

## 2017-08-03 DIAGNOSIS — E785 Hyperlipidemia, unspecified: Secondary | ICD-10-CM | POA: Diagnosis not present

## 2017-08-03 DIAGNOSIS — G4089 Other seizures: Secondary | ICD-10-CM | POA: Diagnosis not present

## 2017-08-08 DIAGNOSIS — Z79899 Other long term (current) drug therapy: Secondary | ICD-10-CM | POA: Diagnosis not present

## 2017-08-08 DIAGNOSIS — G4089 Other seizures: Secondary | ICD-10-CM | POA: Diagnosis not present

## 2017-08-08 DIAGNOSIS — I10 Essential (primary) hypertension: Secondary | ICD-10-CM | POA: Diagnosis not present

## 2017-08-08 DIAGNOSIS — E785 Hyperlipidemia, unspecified: Secondary | ICD-10-CM | POA: Diagnosis not present

## 2018-01-10 NOTE — Progress Notes (Signed)
Cardiology Office Note  Date: 01/11/2018   ID: Destiny Hoffman, DOB 29-Sep-1945, MRN [id number]  PCP: Pearson Grippe, MD  Primary Cardiologist: Nona Dell, MD   Chief Complaint  Patient presents with  . Coronary Artery Disease  . Cardiomyopathy    History of Present Illness: Destiny Hoffman is a 72 y.o. female not seen since December 2017.  She presents overdue for follow-up.  She tells me that she has done well, NYHA class II dyspnea, no exertional chest pain, palpitations, or syncope.  She participated in the local senior games and got second place in the softball throw and the discus throw.  She showed me a picture of her metal and certificate.  Last echocardiogram from May 2017 revealed LVEF 30 to 35% range as detailed below.  We discussed obtaining an updated study.  Current medications include aspirin, Coreg, Crestor, and as needed nitroglycerin.  She has an ACE inhibitor allergy.  She states that she continues to follow with Dr. Selena Batten, we are requesting her most recent lab work.  I personally reviewed her ECG today which shows sinus bradycardia with low voltage, poor R wave progression rule out old anterior infarct pattern, nonspecific ST changes.  Past Medical History:  Diagnosis Date  . Coronary atherosclerosis of native coronary artery    BMS to LAD 2011, residual RCA disease managed medically  . Hyperkalemia on ACE inhibitors   . Ischemic cardiomyopathy    LVEF 25-30% echo 2014  . Myocardial infarction, anterior wall (HCC) 2011  . Seizure disorder [facility])     Past Surgical History:  Procedure Laterality Date  . NO PAST SURGERIES      Current Outpatient Medications  Medication Sig Dispense Refill  . aspirin 81 MG tablet Take 81 mg by mouth daily.      . carvedilol (COREG) 6.25 MG tablet TAKE ONE TABLET BY MOUTH TWICE DAILY 180 tablet 2  . nitroGLYCERIN (NITROSTAT) 0.4 MG SL tablet Place 1 tablet (0.4 mg total) under the tongue every 5 (five) minutes x 3 doses  as needed (If no relief after 3rd dose, proceed to the ED for an evalution). 25 tablet 3  . PHENObarbital (LUMINAL) 30 MG tablet Take 30 mg by mouth 2 (two) times daily.      . Pomegranate, Punica granatum, (POMEGRANATE PO) Take by mouth. Juice - drinking occasionally    . rosuvastatin (CRESTOR) 20 MG tablet Take 1 tablet (20 mg total) by mouth daily. 30 tablet 6   No current facility-administered medications for this visit.    Allergies:  Ace inhibitors   Social History: The patient  reports that she quit smoking about 7 years ago. Her smoking use included cigarettes. She has never used smokeless tobacco. She reports that she does not drink alcohol or use drugs.   ROS:  Please see the history of present illness. Otherwise, complete review of systems is positive for none.  All other systems are reviewed and negative.   Physical Exam: VS:  BP 112/65   Pulse 63   Ht 5\' 2"  (1.575 m)   Wt 140 lb (63.5 kg)   SpO2 96%   BMI 25.61 kg/m , BMI Body mass index is 25.61 kg/m.  Wt Readings from Last 3 Encounters:  01/11/18 140 lb (63.5 kg)  06/27/16 139 lb 12.8 oz (63.4 kg)  11/10/15 130 lb (59 kg)    General: Patient appears comfortable at rest. HEENT: Conjunctiva and lids normal, oropharynx clear. Neck: Supple, no elevated JVP or  carotid bruits, no thyromegaly. Lungs: Clear to auscultation, nonlabored breathing at rest. Cardiac: Regular rate and rhythm, no S3, soft systolic murmur. Abdomen: Soft, nontender, bowel sounds present. Extremities: No pitting edema, distal pulses 2+. Skin: Warm and dry. Musculoskeletal: No kyphosis. Neuropsychiatric: Alert and oriented x3, affect grossly appropriate.  ECG: I personally reviewed the tracing from 11/10/2015 which shows sinus rhythm with possible old anteroseptal infarct pattern.  Recent Labwork:    Component Value Date/Time   CHOL 119 02/15/2012 1156   TRIG 74.0 02/15/2012 1156   HDL 66.70 02/15/2012 1156   CHOLHDL 2 02/15/2012 1156    VLDL 14.8 02/15/2012 1156   LDLCALC 38 02/15/2012 1156  June 2015: Hemoglobin 12.0, platelets 173, potassium 4.6, BUN 18, creatinine 0.8, AST 15, ALT 10, cholesterol 114, triglycerides 82, HDL 50, LDL 48, TSH 1.09  Other Studies Reviewed Today:  Echocardiogram 11/17/2015: Study Conclusions  - Left ventricle: The cavity size was normal. Wall thickness was   normal. Systolic function was moderately to severely reduced. The   estimated ejection fraction was in the range of 30% to 35%.   Technically difficult assessment of LV function, consider limited   study with echocontrast to better evaluate. Doppler parameters   are consistent with abnormal left ventricular relaxation (grade 1   diastolic dysfunction). - Regional wall motion abnormality: Akinesis of the basal-mid   anteroseptal myocardium. Cannot distinguish additional focal wall   motion abnormalities, endocardium poorly visualized. - Aortic valve: Mildly calcified annulus. Normal thickness   leaflets. There was mild regurgitation. Valve area (VTI): 1.93   cm^2. Valve area (Vmax): 1.73 cm^2. Valve area (Vmean): 2.08   cm^2. - Mitral valve: Mildly calcified annulus. Normal thickness leaflets. - Left atrium: The atrium was moderately dilated. - Right atrium: The atrium was mildly dilated. - Tricuspid valve: There was mild-moderate regurgitation. - Technically difficult study. Consider limited echo with   echocontrast to better evaluate LVEF and wall motion.  Assessment and Plan:  1.  CAD with history of BMS to the LAD in 2011 with residual RCA disease that has been managed medically.  She reports no angina symptoms or nitroglycerin use.  Her voiding refill for old bottle of nitroglycerin.  Otherwise continue aspirin and statin.  2.  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  She has declined ICD.  Reports no palpitations or syncope.  We reviewed her current regimen which includes Coreg.  She has an ACE inhibitor allergy  and has generally been hesitant to try additional medications.  We will obtain a follow-up echocardiogram to reassess LVEF.  3.  Hyperlipidemia, on Crestor.  She continues to follow with Dr. Selena BattenKim.  Current medicines were reviewed with the patient today.   Orders Placed This Encounter  Procedures  . EKG 12-Lead  . ECHOCARDIOGRAM COMPLETE    Disposition: Follow-up in 6 months.  Signed, Jonelle SidleSamuel G. McDowell, MD, [facility] [employer] 01/11/2018 11:23 AM    [facility] [address], [place], [place] [address] Phone: [phone]; Fax: [phone]

## 2018-01-11 ENCOUNTER — Encounter: Payer: Self-pay | Admitting: *Deleted

## 2018-01-11 ENCOUNTER — Other Ambulatory Visit: Payer: Self-pay

## 2018-01-11 ENCOUNTER — Encounter: Payer: Self-pay | Admitting: Cardiology

## 2018-01-11 ENCOUNTER — Ambulatory Visit: Payer: Medicare Other | Admitting: Cardiology

## 2018-01-11 VITALS — BP 112/65 | HR 63 | Ht 62.0 in | Wt 140.0 lb

## 2018-01-11 DIAGNOSIS — I2510 Atherosclerotic heart disease of native coronary artery without angina pectoris: Secondary | ICD-10-CM | POA: Diagnosis not present

## 2018-01-11 DIAGNOSIS — E782 Mixed hyperlipidemia: Secondary | ICD-10-CM

## 2018-01-11 DIAGNOSIS — I255 Ischemic cardiomyopathy: Secondary | ICD-10-CM

## 2018-01-11 MED ORDER — NITROGLYCERIN 0.4 MG SL SUBL: 0.4000 mg | SUBLINGUAL_TABLET | SUBLINGUAL | 3 refills | Status: AC | PRN

## 2018-01-11 NOTE — Patient Instructions (Addendum)

## 2018-01-21 ENCOUNTER — Other Ambulatory Visit (HOSPITAL_COMMUNITY): Payer: Self-pay | Admitting: Family Medicine

## 2018-01-21 ENCOUNTER — Ambulatory Visit (HOSPITAL_COMMUNITY)
Admission: RE | Admit: 2018-01-21 | Discharge: 2018-01-21 | Disposition: A | Discharge status: Home / Self Care | Payer: Medicare Other | Source: Ambulatory Visit | Attending: Family Medicine | Admitting: Family Medicine

## 2018-01-21 DIAGNOSIS — S90922A Unspecified superficial injury of left foot, initial encounter: Secondary | ICD-10-CM | POA: Diagnosis not present

## 2018-01-21 DIAGNOSIS — M79672 Pain in left foot: Secondary | ICD-10-CM

## 2018-01-21 DIAGNOSIS — S9032XA Contusion of left foot, initial encounter: Secondary | ICD-10-CM | POA: Diagnosis not present

## 2018-02-04 DIAGNOSIS — Z0001 Encounter for general adult medical examination with abnormal findings: Secondary | ICD-10-CM | POA: Diagnosis not present

## 2018-02-04 DIAGNOSIS — Z131 Encounter for screening for diabetes mellitus: Secondary | ICD-10-CM | POA: Diagnosis not present

## 2018-02-04 DIAGNOSIS — Z13228 Encounter for screening for other metabolic disorders: Secondary | ICD-10-CM | POA: Diagnosis not present

## 2018-02-04 DIAGNOSIS — I10 Essential (primary) hypertension: Secondary | ICD-10-CM | POA: Diagnosis not present

## 2018-02-05 ENCOUNTER — Ambulatory Visit (INDEPENDENT_AMBULATORY_CARE_PROVIDER_SITE_OTHER): Payer: Medicare Other

## 2018-02-05 ENCOUNTER — Other Ambulatory Visit: Payer: Self-pay

## 2018-02-05 DIAGNOSIS — I255 Ischemic cardiomyopathy: Secondary | ICD-10-CM

## 2018-02-06 ENCOUNTER — Telehealth: Payer: Self-pay | Admitting: *Deleted

## 2018-02-06 NOTE — Telephone Encounter (Signed)
-----   Message from Jonelle SidleSamuel G McDowell, MD sent at 02/06/2018  1:27 PM EDT ----- Results reviewed.  No significant change in LVEF, stable at 30 to 35%.  Continue with current medications and follow-up plan. A copy of this test should be forwarded to Pearson GrippeKim, James, MD.

## 2018-02-07 ENCOUNTER — Other Ambulatory Visit: Payer: Medicare Other

## 2018-02-07 DIAGNOSIS — Z13820 Encounter for screening for osteoporosis: Secondary | ICD-10-CM | POA: Diagnosis not present

## 2018-02-07 DIAGNOSIS — E785 Hyperlipidemia, unspecified: Secondary | ICD-10-CM | POA: Diagnosis not present

## 2018-02-07 DIAGNOSIS — Z1231 Encounter for screening mammogram for malignant neoplasm of breast: Secondary | ICD-10-CM | POA: Diagnosis not present

## 2018-02-07 DIAGNOSIS — D225 Melanocytic nevi of trunk: Secondary | ICD-10-CM | POA: Diagnosis not present

## 2018-02-14 NOTE — Telephone Encounter (Signed)
Patient informed. Copy sent to PCP °

## 2018-02-20 ENCOUNTER — Other Ambulatory Visit (HOSPITAL_COMMUNITY): Payer: Self-pay | Admitting: Family Medicine

## 2018-02-20 DIAGNOSIS — Z1231 Encounter for screening mammogram for malignant neoplasm of breast: Secondary | ICD-10-CM

## 2018-02-20 DIAGNOSIS — Z780 Asymptomatic menopausal state: Secondary | ICD-10-CM

## 2018-11-01 ENCOUNTER — Ambulatory Visit: Payer: Medicare Other | Admitting: Cardiology

## 2018-11-26 ENCOUNTER — Encounter: Payer: Self-pay | Admitting: Cardiology

## 2018-11-26 ENCOUNTER — Telehealth: Payer: Self-pay | Admitting: Cardiology

## 2018-11-26 NOTE — Telephone Encounter (Signed)

## 2018-11-26 NOTE — Progress Notes (Signed)
Virtual Visit via Telephone Note   This visit type was conducted due to national recommendations for restrictions regarding the COVID-19 Pandemic (e.g. social distancing) in an effort to limit this patient's exposure and mitigate transmission in our community.  Due to her co-morbid illnesses, this patient is at least at moderate risk for complications without adequate follow up.  This format is felt to be most appropriate for this patient at this time.  The patient did not have access to video technology/had technical difficulties with video requiring transitioning to audio format only (telephone).  All issues noted in this document were discussed and addressed.  No physical exam could be performed with this format.  Please refer to the patient's chart for her  consent to telehealth for [facility].   Date:  11/27/2018   ID:  Destiny Hoffman, DOB 12/30/1945, MRN [id number]  Patient Location: Home Provider Location: Office  PCP:  Pearson Grippe, MD  Cardiologist:  Nona Dell, MD  Evaluation Performed:  Follow-Up Visit  Chief Complaint:   Cardiac follow-up  History of Present Illness:    Destiny Hoffman is a 73 y.o. female last seen in June 2019.  She did not have video access today and we spoke by phone.  She continues to do very well.  She enjoys walking for exercise, in fact she has been going to [place] and walking the perimeter of the store with a mask on.  She reports NYHA class II dyspnea, no angina symptoms.  No palpitations or syncope.  Follow-up echocardiogram in July 2019 revealed LVEF 30 to 35% range.  This has been stable over time.  She has an ACE inhibitor allergy, also history of hyperkalemia.  We have therefore not pursued ACE inhibitor, ARB, ANRI, or Aldactone.  Fortunately, her weight has been stable and she does not show evidence of fluid overload.  Also tends to run low normal blood pressure.  We have discussed ICD, and this has not been pursued.  I reviewed her  medications which are outlined below.  She will be having blood work with PCP sometime this summer.  The patient does not have symptoms concerning for COVID-19 infection (fever, chills, cough, or new shortness of breath).    Past Medical History:  Diagnosis Date  . Coronary atherosclerosis of native coronary artery    BMS to LAD 2011, residual RCA disease managed medically  . Hyperkalemia on ACE inhibitors   . Ischemic cardiomyopathy    LVEF 25-30% echo 2014  . Myocardial infarction, anterior wall (HCC) 2011  . Seizure disorder [facility])    Past Surgical History:  Procedure Laterality Date  . NO PAST SURGERIES       Current Meds  Medication Sig  . aspirin 81 MG tablet Take 81 mg by mouth daily.    . carvedilol (COREG) 25 MG tablet Take 25 mg by mouth 2 (two) times daily with a meal.  . nitroGLYCERIN (NITROSTAT) 0.4 MG SL tablet Place 1 tablet (0.4 mg total) under the tongue every 5 (five) minutes x 3 doses as needed (If no relief after 3rd dose, proceed to the ED for an evalution).  Marland Kitchen PHENObarbital (LUMINAL) 30 MG tablet Take 30 mg by mouth 3 (three) times daily.   . rosuvastatin (CRESTOR) 20 MG tablet Take 1 tablet (20 mg total) by mouth daily.     Allergies:   Ace inhibitors   Social History   Tobacco Use  . Smoking status: Former Smoker    Types: Cigarettes  Last attempt to quit: 07/17/2010    Years since quitting: 8.3  . Smokeless tobacco: Never Used  Substance Use Topics  . Alcohol use: No    Alcohol/week: 0.0 standard drinks  . Drug use: No     Family Hx: The patient's family history includes Heart attack in her father.  ROS:   Please see the history of present illness.    All other systems reviewed and are negative.   Prior CV studies:   The following studies were reviewed today:  Echocardiogram 02/05/2018: Study Conclusions  - Left ventricle: The cavity size was mildly dilated. Wall   thickness was normal. Systolic function was moderately to   severely  reduced. The estimated ejection fraction was in the   range of 30% to 35%. There is akinesis of the   mid-apicalanteroseptal and apical myocardium. There is akinesis   of the apicalanterolateral myocardium. Features are consistent   with a pseudonormal left ventricular filling pattern, with   concomitant abnormal relaxation and increased filling pressure   (grade 2 diastolic dysfunction). - Aortic valve: Mildly calcified annulus. Trileaflet. There was   trivial regurgitation. - Mitral valve: Mildly calcified annulus. There was mild to   moderate regurgitation. - Right atrium: Central venous pressure (est): 3 mm Hg. - Atrial septum: No defect or patent foramen ovale was identified. - Tricuspid valve: There was moderate regurgitation. - Pulmonary arteries: PA peak pressure: 39 mm Hg (S). - Pericardium, extracardiac: There was no pericardial effusion.  Labs/Other Tests and Data Reviewed:    EKG:  An ECG dated 01/11/2018 was personally reviewed today and demonstrated:  Sinus bradycardia with low voltage, poor R wave progression, rule out old anterior infarct pattern, nonspecific ST changes.  Recent Labs:  12/03/2017: Hemoglobin 12.2, platelets 221, BUN 14, creatinine 0.61, potassium 5.2, AST 17, ALT 12, cholesterol 130, triglycerides 139, HDL 56, LDL 46, hemoglobin A1c 5.7%, TSH 1.25  Wt Readings from Last 3 Encounters:  11/27/18 140 lb (63.5 kg)  01/11/18 140 lb (63.5 kg)  06/27/16 139 lb 12.8 oz (63.4 kg)     Objective:    Vital Signs:  BP (!) 115/59   Pulse 62   Ht 5\' 2"  (1.575 m)   Wt 140 lb (63.5 kg)   BMI 25.61 kg/m    Patient spoke in full sentences on the phone, no shortness of breath. Voice tone and speech pattern were normal. No audible wheezing.  ASSESSMENT & PLAN:    1.  CAD status post BMS to the LAD in 2011 with residual RCA disease that has been managed medically.  She reports no angina symptoms at this time and has good exercise tolerance.  Continue aspirin and  statin.  2.  Ischemic cardiomyopathy with LVEF 30 to 35%, stable by follow-up echocardiogram.  As noted above, medications have been somewhat limited but she is clinically stable, has not wanted to pursue ICD.  3.  Mixed hyperlipidemia on Crestor.  She anticipates follow-up lab work with PCP this summer.  COVID-19 Education: The signs and symptoms of COVID-19 were discussed with the patient and how to seek care for testing (follow up with PCP or arrange E-visit).  The importance of social distancing was discussed today.  Time:   Today, I have spent 6 minutes with the patient with telehealth technology discussing the above problems.     Medication Adjustments/Labs and Tests Ordered: Current medicines are reviewed at length with the patient today.  Concerns regarding medicines are outlined above.   Tests  Ordered: No orders of the defined types were placed in this encounter.   Medication Changes: No orders of the defined types were placed in this encounter.   Disposition:  Follow up 6 months in the [place] office.  Signed, Nona Dell, MD  11/27/2018 2:08 PM    [facility]

## 2018-11-27 ENCOUNTER — Telehealth (INDEPENDENT_AMBULATORY_CARE_PROVIDER_SITE_OTHER): Payer: Medicare Other | Admitting: Cardiology

## 2018-11-27 ENCOUNTER — Encounter: Payer: Self-pay | Admitting: Cardiology

## 2018-11-27 VITALS — BP 115/59 | HR 62 | Ht 62.0 in | Wt 140.0 lb

## 2018-11-27 DIAGNOSIS — Z7189 Other specified counseling: Secondary | ICD-10-CM | POA: Diagnosis not present

## 2018-11-27 DIAGNOSIS — E782 Mixed hyperlipidemia: Secondary | ICD-10-CM | POA: Diagnosis not present

## 2018-11-27 DIAGNOSIS — I255 Ischemic cardiomyopathy: Secondary | ICD-10-CM

## 2018-11-27 DIAGNOSIS — I2510 Atherosclerotic heart disease of native coronary artery without angina pectoris: Secondary | ICD-10-CM

## 2018-11-27 NOTE — Patient Instructions (Addendum)

## 2018-11-29 ENCOUNTER — Telehealth: Payer: Self-pay | Admitting: Cardiology

## 2018-11-29 NOTE — Telephone Encounter (Signed)
[facility] contacted about denial for crestor and said that patient has to bring in her new insurance card. Patient contacted and advised to take her new insurance card to [place]. Verbalized understanding.

## 2018-11-29 NOTE — Telephone Encounter (Signed)
Patient states that her insurance will not cover rosuvastatin (CRESTOR) 20 MG tablet  States that she is taking Fish Oil 1000mg  also.   [facility], [place]

## 2019-06-05 ENCOUNTER — Encounter: Payer: Self-pay | Admitting: Cardiology

## 2019-06-05 ENCOUNTER — Encounter: Payer: Self-pay | Admitting: *Deleted

## 2019-06-05 ENCOUNTER — Telehealth (INDEPENDENT_AMBULATORY_CARE_PROVIDER_SITE_OTHER): Payer: Medicare Other | Admitting: Family Medicine

## 2019-06-05 VITALS — Ht 62.0 in | Wt 135.0 lb

## 2019-06-05 DIAGNOSIS — I255 Ischemic cardiomyopathy: Secondary | ICD-10-CM | POA: Diagnosis not present

## 2019-06-05 DIAGNOSIS — I2510 Atherosclerotic heart disease of native coronary artery without angina pectoris: Secondary | ICD-10-CM

## 2019-06-05 DIAGNOSIS — E782 Mixed hyperlipidemia: Secondary | ICD-10-CM | POA: Diagnosis not present

## 2019-06-05 NOTE — Patient Instructions (Addendum)
Medication Instructions:   Your physician recommends that you continue on your current medications as directed. Please refer to the Current Medication list given to you today.  Labwork:  NONE  Testing/Procedures:  NONE  Follow-Up:  Your physician recommends that you schedule a follow-up appointment in: 6 months for a virtual visit. You will receive a reminder letter in the mail in about 4 months reminding you to call and schedule your appointment. If you don't receive this letter, please contact our office.  Any Other Special Instructions Will Be Listed Below (If Applicable).  If you need a refill on your cardiac medications before your next appointment, please call your pharmacy.

## 2019-06-05 NOTE — Progress Notes (Signed)
Virtual Visit via Telephone Note   This visit type was conducted due to national recommendations for restrictions regarding the COVID-19 Pandemic (e.g. social distancing) in an effort to limit this patient's exposure and mitigate transmission in our community.  Due to her co-morbid illnesses, this patient is at least at moderate risk for complications without adequate follow up.  This format is felt to be most appropriate for this patient at this time.  The patient did not have access to video technology/had technical difficulties with video requiring transitioning to audio format only (telephone).  All issues noted in this document were discussed and addressed.  No physical exam could be performed with this format.  Please refer to the patient's chart for her  consent to telehealth [facility].   Date:  06/05/2019   ID:  Destiny Hoffman, DOB [birth year], MRN [id number]  Patient Location: Home Provider Location: Office  PCP:  Pearson GrippeKim, James, MD  Cardiologist:  Nona DellSamuel McDowell, MD Electrophysiologist:  None   Evaluation Performed:  Follow-Up Visit  Chief Complaint: Follow-up coronary artery disease and ischemic cardiomyopathy  History of Present Illness:    Destiny KanskyShirley A Gambrill is a 73 y.o. female last seen by telehealth May 2020. History of coronary artery disease and ischemic cardiomyopathy. Last echocardiogram in July 2019 showed left ventricular ejection fraction of 30 to 35% with akinesis in the mid apical anteroseptal and apical myocardium.  Grade 2 diastolic dysfunction.  Patient states she has been walking some around her home but is not very active on a day-to-day basis.  States [place] goes [place] [employer] and walks around without a mask.  Advised patient she probably needs to wear a mask and use good hand hygiene when going out in public.  Patient denies any anginal symptoms other than mild dyspnea on mild to moderate exertion which she attributes to sedentary lifestyle and  lack of exercise/deconditioning.  EKG reviewed today from June 2019 showed sinus bradycardia with a rate of 57.  Denies any significant weight gain, or lower extremity edema.  States she was unable to take her weight today.  Weight recorded today was stated by patient 135 pounds.  Previous weight 140 pounds.  Recent creatinine 0.61 and GFR 97.   Past Medical History:  Diagnosis Date  . Coronary atherosclerosis of native coronary artery    BMS to LAD 2011, residual RCA disease managed medically  . Hyperkalemia on ACE inhibitors   . Ischemic cardiomyopathy    LVEF 25-30% echo 2014  . Myocardial infarction, anterior wall (HCC) 2011  . Seizure disorder [facility](HCC)    Past Surgical History:  Procedure Laterality Date  . NO PAST SURGERIES       Current Meds  Medication Sig  . aspirin 81 MG tablet Take 81 mg by mouth daily.    . carvedilol (COREG) 6.25 MG tablet Take 6.25 mg by mouth 2 (two) times daily.  . nitroGLYCERIN (NITROSTAT) 0.4 MG SL tablet Place 1 tablet (0.4 mg total) under the tongue every 5 (five) minutes x 3 doses as needed (If no relief after 3rd dose, proceed to the ED for an evalution).  . Omega-3 Fatty Acids (FISH OIL) 1000 MG CAPS Take 1 capsule by mouth daily.  Marland Kitchen. PHENObarbital (LUMINAL) 30 MG tablet Take 30 mg by mouth 3 (three) times daily.   . rosuvastatin (CRESTOR) 20 MG tablet Take 1 tablet (20 mg total) by mouth daily.     Allergies:   Ace inhibitors   Social History  Tobacco Use  . Smoking status: Former Smoker    Types: Cigarettes    Quit date: 07/17/2010    Years since quitting: 8.8  . Smokeless tobacco: Never Used  Substance Use Topics  . Alcohol use: No    Alcohol/week: 0.0 standard drinks  . Drug use: No     Family Hx: The patient's family history includes Heart attack in her father.  ROS:   Please see the history of present illness.    All other systems reviewed and are negative.   Prior CV studies:   The following studies were reviewed today:  Echocardiogram 02/05/2018: Study Conclusions  - Left ventricle: The cavity size was mildly dilated. Wall thickness was normal. Systolic function was moderately to severely reduced. The estimated ejection fraction was in the range of 30% to 35%. There is akinesis of the mid-apicalanteroseptal and apical myocardium. There is akinesis of the apicalanterolateral myocardium. Features are consistent with a pseudonormal left ventricular filling pattern, with concomitant abnormal relaxation and increased filling pressure (grade 2 diastolic dysfunction). - Aortic valve: Mildly calcified annulus. Trileaflet. There was trivial regurgitation. - Mitral valve: Mildly calcified annulus. There was mild to moderate regurgitation. - Right atrium: Central venous pressure (est): 3 mm Hg. - Atrial septum: No defect or patent foramen ovale was identified. - Tricuspid valve: There was moderate regurgitation. - Pulmonary arteries: PA peak pressure: 39 mm Hg (S). - Pericardium, extracardiac: There was no pericardial effusion.   Labs/Other Tests and Data Reviewed:    EKG:  An ECG dated 01/11/2018 was personally reviewed today and demonstrated:  Sinus bradycardia rate of 57  Recent Labs:  Lab work in July 2019 showed hemoglobin of 12.2, hematocrit 37.7, creatinine 0.61, GFR 91, sodium 146, potassium 5.2, AST 17, ALT 12, total cholesterol 130, triglycerides 139, HDL 56, LDL 46, hemoglobin A1c 5.7, TSH 1.25  Recent Lipid Panel Lab Results  Component Value Date/Time   CHOL 119 02/15/2012 11:56 AM   TRIG 74.0 02/15/2012 11:56 AM   HDL 66.70 02/15/2012 11:56 AM   CHOLHDL 2 02/15/2012 11:56 AM   LDLCALC 38 02/15/2012 11:56 AM    Wt Readings from Last 3 Encounters:  06/05/19 135 lb (61.2 kg)  11/27/18 140 lb (63.5 kg)  01/11/18 140 lb (63.5 kg)     Objective:    Vital Signs:  Ht 5\' 2"  (1.575 m)   Wt 135 lb (61.2 kg)   BMI 24.69 kg/m     Patient speaking in complete sentences  with normal speech pattern.  No evidence of shortness of breath, cough, or wheezing.   ASSESSMENT & PLAN:    1.  CAD status post BMS to the LAD in 2011 with residual RCA disease.  Patient denies any recent increase in anginal or exertional symptoms other than mild dyspnea which she attributes to sedentary lifestyle and lack of exercise.  Continue on aspirin and statin medication.  2.  Ischemic cardiomyopathy with LVEF 30 to 35%.  Patient continues to decline ICD placement.  States she is doing fine and having no issues.  Denies any arrhythmias or increasing dyspnea on exertion or weight gain.  Continue beta-blocker.  3.  Mixed hyperlipidemia on Crestor. total cholesterol 130, triglycerides 139, HDL 56, LDL 46, in July 2019.  Continue statin medication   COVID-19 Education: The signs and symptoms of COVID-19 were discussed with the patient and how to seek care for testing (follow up with PCP or arrange E-visit). The importance of social distancing was discussed today.  Time:  Today, I have spent 15 minutes with the patient with telehealth technology discussing the above problems.     Medication Adjustments/Labs and Tests Ordered: Current medicines are reviewed at length with the patient today.  Concerns regarding medicines are outlined above.   Tests Ordered: No orders of the defined types were placed in this encounter.   Medication Changes: No orders of the defined types were placed in this encounter.   Follow Up:  Virtual Visit  in 6 month(s)  Signed, Verta Ellen, NP  06/05/2019 10:39 AM    [place]

## 2019-09-16 DIAGNOSIS — E860 Dehydration: Secondary | ICD-10-CM | POA: Diagnosis not present

## 2019-09-16 DIAGNOSIS — Z79899 Other long term (current) drug therapy: Secondary | ICD-10-CM | POA: Diagnosis not present

## 2019-09-16 DIAGNOSIS — I10 Essential (primary) hypertension: Secondary | ICD-10-CM | POA: Diagnosis not present

## 2019-09-16 DIAGNOSIS — E785 Hyperlipidemia, unspecified: Secondary | ICD-10-CM | POA: Diagnosis not present

## 2019-09-18 DIAGNOSIS — E785 Hyperlipidemia, unspecified: Secondary | ICD-10-CM | POA: Diagnosis not present

## 2019-09-18 DIAGNOSIS — E875 Hyperkalemia: Secondary | ICD-10-CM | POA: Diagnosis not present

## 2019-09-18 DIAGNOSIS — G4089 Other seizures: Secondary | ICD-10-CM | POA: Diagnosis not present

## 2019-09-18 DIAGNOSIS — R7303 Prediabetes: Secondary | ICD-10-CM | POA: Diagnosis not present

## 2019-11-03 DIAGNOSIS — E875 Hyperkalemia: Secondary | ICD-10-CM | POA: Diagnosis not present

## 2019-12-23 DIAGNOSIS — I10 Essential (primary) hypertension: Secondary | ICD-10-CM | POA: Diagnosis not present

## 2019-12-23 DIAGNOSIS — E785 Hyperlipidemia, unspecified: Secondary | ICD-10-CM | POA: Diagnosis not present

## 2019-12-23 DIAGNOSIS — G4089 Other seizures: Secondary | ICD-10-CM | POA: Diagnosis not present

## 2019-12-24 DIAGNOSIS — E785 Hyperlipidemia, unspecified: Secondary | ICD-10-CM | POA: Diagnosis not present

## 2019-12-24 DIAGNOSIS — G4089 Other seizures: Secondary | ICD-10-CM | POA: Diagnosis not present

## 2019-12-24 DIAGNOSIS — E875 Hyperkalemia: Secondary | ICD-10-CM | POA: Diagnosis not present

## 2019-12-24 DIAGNOSIS — I10 Essential (primary) hypertension: Secondary | ICD-10-CM | POA: Diagnosis not present

## 2020-03-25 DIAGNOSIS — E785 Hyperlipidemia, unspecified: Secondary | ICD-10-CM | POA: Diagnosis not present

## 2020-03-25 DIAGNOSIS — G4089 Other seizures: Secondary | ICD-10-CM | POA: Diagnosis not present

## 2020-03-25 DIAGNOSIS — I10 Essential (primary) hypertension: Secondary | ICD-10-CM | POA: Diagnosis not present

## 2020-04-13 DIAGNOSIS — I2510 Atherosclerotic heart disease of native coronary artery without angina pectoris: Secondary | ICD-10-CM | POA: Diagnosis not present

## 2020-04-13 DIAGNOSIS — I10 Essential (primary) hypertension: Secondary | ICD-10-CM | POA: Diagnosis not present

## 2020-04-13 DIAGNOSIS — Z79899 Other long term (current) drug therapy: Secondary | ICD-10-CM | POA: Diagnosis not present

## 2020-04-13 DIAGNOSIS — E785 Hyperlipidemia, unspecified: Secondary | ICD-10-CM | POA: Diagnosis not present

## 2020-05-18 ENCOUNTER — Ambulatory Visit (HOSPITAL_COMMUNITY)
Admission: RE | Admit: 2020-05-18 | Discharge: 2020-05-18 | Disposition: A | Discharge status: Home / Self Care | Payer: Medicare Other | Source: Ambulatory Visit | Attending: Family Medicine | Admitting: Family Medicine

## 2020-05-18 ENCOUNTER — Other Ambulatory Visit (HOSPITAL_COMMUNITY): Payer: Self-pay | Admitting: Family Medicine

## 2020-05-18 ENCOUNTER — Other Ambulatory Visit: Payer: Self-pay

## 2020-05-18 DIAGNOSIS — W19XXXA Unspecified fall, initial encounter: Secondary | ICD-10-CM | POA: Diagnosis not present

## 2020-05-18 DIAGNOSIS — M79671 Pain in right foot: Secondary | ICD-10-CM

## 2020-06-30 ENCOUNTER — Ambulatory Visit: Payer: Medicare Other | Admitting: Cardiology

## 2020-06-30 ENCOUNTER — Encounter: Payer: Self-pay | Admitting: Cardiology

## 2020-06-30 VITALS — BP 136/70 | HR 58 | Ht 62.0 in | Wt 138.0 lb

## 2020-06-30 DIAGNOSIS — I25119 Atherosclerotic heart disease of native coronary artery with unspecified angina pectoris: Secondary | ICD-10-CM

## 2020-06-30 DIAGNOSIS — I255 Ischemic cardiomyopathy: Secondary | ICD-10-CM

## 2020-06-30 NOTE — Addendum Note (Signed)
Addended by: Eustace Moore on: 06/30/2020 05:12 PM   Modules accepted: Orders

## 2020-06-30 NOTE — Patient Instructions (Addendum)

## 2020-06-30 NOTE — Progress Notes (Signed)
Cardiology Office Note  Date: 06/30/2020   ID: Destiny Hoffman, DOB December 16, 1945, MRN [id number]  PCP:  Pearson Grippe, MD  Cardiologist:  Nona Dell, MD Electrophysiologist:  None   Chief Complaint  Patient presents with  . Cardiac follow-up    History of Present Illness: Destiny Hoffman is a 74 y.o. female last assessed via telehealth encounter in November 2020 by Mr. Vincenza Hews NP.  She presents for a routine visit.  Reports NYHA class II dyspnea, no exertional chest pain, palpitations, or syncope.  Her last echocardiogram was in 2019 as outlined below, LVEF 30 to 35% range.  Medical therapy is limited in the setting of hyperkalemia and also allergy to ACE inhibitor's.  She has tolerated the present regimen including Coreg, Crestor, and aspirin.  ICD discussed and has not been pursued.  I personally reviewed her ECG today which shows sinus bradycardia.  Past Medical History:  Diagnosis Date  . Coronary atherosclerosis of native coronary artery    BMS to LAD 2011, residual RCA disease managed medically  . Hyperkalemia on ACE inhibitors   . Ischemic cardiomyopathy    LVEF 25-30% echo 2014  . Myocardial infarction, anterior wall (HCC) 2011  . Seizure disorder [facility])     Past Surgical History:  Procedure Laterality Date  . NO PAST SURGERIES      Current Outpatient Medications  Medication Sig Dispense Refill  . aspirin 81 MG tablet Take 81 mg by mouth daily.    . carvedilol (COREG) 6.25 MG tablet Take 6.25 mg by mouth 2 (two) times daily.    . nitroGLYCERIN (NITROSTAT) 0.4 MG SL tablet Place 1 tablet (0.4 mg total) under the tongue every 5 (five) minutes x 3 doses as needed (If no relief after 3rd dose, proceed to the ED for an evalution). 25 tablet 3  . Omega-3 Fatty Acids (FISH OIL) 1000 MG CAPS Take 1 capsule by mouth daily.    Marland Kitchen PHENObarbital (LUMINAL) 30 MG tablet Take 30 mg by mouth 3 (three) times daily.    . rosuvastatin (CRESTOR) 20 MG tablet Take 1 tablet (20 mg  total) by mouth daily. 30 tablet 6   No current facility-administered medications for this visit.   Allergies:  Ace inhibitors   ROS: No syncope.  Physical Exam: VS:  BP 136/70   Pulse (!) 58   Ht 5\' 2"  (1.575 m)   Wt 138 lb (62.6 kg)   SpO2 98%   BMI 25.24 kg/m , BMI Body mass index is 25.24 kg/m.  Wt Readings from Last 3 Encounters:  06/30/20 138 lb (62.6 kg)  06/05/19 135 lb (61.2 kg)  11/27/18 140 lb (63.5 kg)    General: Patient appears comfortable at rest. HEENT: Conjunctiva and lids normal, wearing a mask. Neck: Supple, no elevated JVP or carotid bruits, no thyromegaly. Lungs: Clear to auscultation, nonlabored breathing at rest. Cardiac: Regular rate and rhythm, no S3 or significant systolic murmur. Extremities: No pitting edema.  ECG:  An ECG dated 01/11/2018 was personally reviewed today and demonstrated:  Sinus rhythm with low voltage and decreased R wave progression.  Recent Labwork:  March 2021: Hemoglobin 13.2, platelets 254, BUN 18, creatinine 0.83, potassium 5.5, AST 17, ALT 13, cholesterol 170, triglycerides 120, LDL 95, hemoglobin A1c 5.9%, TSH 1.49  Other Studies Reviewed Today:  Echocardiogram 02/05/2018: - Left ventricle: The cavity size was mildly dilated. Wall  thickness was normal. Systolic function was moderately to  severely reduced. The estimated ejection fraction was in  the  range of 30% to 35%. There is akinesis of the  mid-apicalanteroseptal and apical myocardium. There is akinesis  of the apicalanterolateral myocardium. Features are consistent  with a pseudonormal left ventricular filling pattern, with  concomitant abnormal relaxation and increased filling pressure  (grade 2 diastolic dysfunction).  - Aortic valve: Mildly calcified annulus. Trileaflet. There was  trivial regurgitation.  - Mitral valve: Mildly calcified annulus. There was mild to  moderate regurgitation.  - Right atrium: Central venous pressure (est):  3 mm Hg.  - Atrial septum: No defect or patent foramen ovale was identified.  - Tricuspid valve: There was moderate regurgitation.  - Pulmonary arteries: PA peak pressure: 39 mm Hg (S).  - Pericardium, extracardiac: There was no pericardial effusion.   Assessment and Plan:  1.  Ischemic cardiomyopathy, LVEF 30 to 35% range.  Weight has been stable, no evidence of fluid overload with chronic systolic heart failure.  Medical therapy is limited in the setting of hyperkalemia and also ACE inhibitor allergy.  Continue Coreg, aspirin, and Crestor.  2.  CAD status post BMS to the LAD in 2011 with residual RCA disease that is managed medically.  She does not report any active angina or nitroglycerin use.  Medication Adjustments/Labs and Tests Ordered: Current medicines are reviewed at length with the patient today.  Concerns regarding medicines are outlined above.   Tests Ordered: No orders of the defined types were placed in this encounter.   Medication Changes: No orders of the defined types were placed in this encounter.   Disposition:  Follow up 6 months in the [place] office.  Signed, Jonelle Sidle, MD, [facility] 06/30/2020 11:50 AM    [facility] at [facility] [address] [place], [place], [place] [address] Phone: [phone]; Fax: [phone]

## 2020-07-19 DIAGNOSIS — R7989 Other specified abnormal findings of blood chemistry: Secondary | ICD-10-CM | POA: Diagnosis not present

## 2020-07-19 DIAGNOSIS — R079 Chest pain, unspecified: Secondary | ICD-10-CM | POA: Diagnosis not present

## 2020-07-19 DIAGNOSIS — R0789 Other chest pain: Secondary | ICD-10-CM | POA: Diagnosis not present

## 2020-07-19 DIAGNOSIS — I10 Essential (primary) hypertension: Secondary | ICD-10-CM | POA: Diagnosis not present

## 2020-07-19 DIAGNOSIS — Z20822 Contact with and (suspected) exposure to covid-19: Secondary | ICD-10-CM | POA: Diagnosis not present

## 2020-07-19 DIAGNOSIS — K859 Acute pancreatitis without necrosis or infection, unspecified: Secondary | ICD-10-CM | POA: Diagnosis not present

## 2020-07-19 DIAGNOSIS — K5730 Diverticulosis of large intestine without perforation or abscess without bleeding: Secondary | ICD-10-CM | POA: Diagnosis not present

## 2020-07-19 DIAGNOSIS — R1011 Right upper quadrant pain: Secondary | ICD-10-CM | POA: Diagnosis not present

## 2020-07-19 DIAGNOSIS — Z955 Presence of coronary angioplasty implant and graft: Secondary | ICD-10-CM | POA: Diagnosis not present

## 2020-07-19 DIAGNOSIS — I2510 Atherosclerotic heart disease of native coronary artery without angina pectoris: Secondary | ICD-10-CM | POA: Diagnosis not present

## 2020-07-19 DIAGNOSIS — E785 Hyperlipidemia, unspecified: Secondary | ICD-10-CM | POA: Diagnosis not present

## 2020-07-19 DIAGNOSIS — U071 COVID-19: Secondary | ICD-10-CM | POA: Diagnosis not present

## 2020-10-04 DIAGNOSIS — I10 Essential (primary) hypertension: Secondary | ICD-10-CM | POA: Diagnosis not present

## 2020-10-04 DIAGNOSIS — I2510 Atherosclerotic heart disease of native coronary artery without angina pectoris: Secondary | ICD-10-CM | POA: Diagnosis not present

## 2020-10-04 DIAGNOSIS — Z131 Encounter for screening for diabetes mellitus: Secondary | ICD-10-CM | POA: Diagnosis not present

## 2020-10-04 DIAGNOSIS — E785 Hyperlipidemia, unspecified: Secondary | ICD-10-CM | POA: Diagnosis not present

## 2020-10-13 DIAGNOSIS — I10 Essential (primary) hypertension: Secondary | ICD-10-CM | POA: Diagnosis not present

## 2020-10-13 DIAGNOSIS — E785 Hyperlipidemia, unspecified: Secondary | ICD-10-CM | POA: Diagnosis not present

## 2020-10-13 DIAGNOSIS — E860 Dehydration: Secondary | ICD-10-CM | POA: Diagnosis not present

## 2020-10-13 DIAGNOSIS — G4089 Other seizures: Secondary | ICD-10-CM | POA: Diagnosis not present

## 2021-01-06 DIAGNOSIS — E785 Hyperlipidemia, unspecified: Secondary | ICD-10-CM | POA: Diagnosis not present

## 2021-01-06 DIAGNOSIS — G4089 Other seizures: Secondary | ICD-10-CM | POA: Diagnosis not present

## 2021-01-06 DIAGNOSIS — I2510 Atherosclerotic heart disease of native coronary artery without angina pectoris: Secondary | ICD-10-CM | POA: Diagnosis not present

## 2021-01-06 DIAGNOSIS — I10 Essential (primary) hypertension: Secondary | ICD-10-CM | POA: Diagnosis not present

## 2021-01-12 DIAGNOSIS — G4089 Other seizures: Secondary | ICD-10-CM | POA: Diagnosis not present

## 2021-01-12 DIAGNOSIS — I10 Essential (primary) hypertension: Secondary | ICD-10-CM | POA: Diagnosis not present

## 2021-01-12 DIAGNOSIS — E785 Hyperlipidemia, unspecified: Secondary | ICD-10-CM | POA: Diagnosis not present

## 2021-01-12 DIAGNOSIS — Z79899 Other long term (current) drug therapy: Secondary | ICD-10-CM | POA: Diagnosis not present

## 2021-02-01 NOTE — Progress Notes (Signed)
Cardiology Office Note  Date: 02/02/2021   ID: Destiny Hoffman, DOB [birth year], MRN [id number]  PCP:  Pearson Grippe, MD  Cardiologist:  Nona Dell, MD Electrophysiologist:  None   Chief Complaint  Patient presents with   Cardiac follow-up    History of Present Illness: Destiny Hoffman is a 75 y.o. female last seen in December 2021.  She presents for a routine visit.  States that she has been doing well, NYHA class I-II dyspnea with typical activities, no exertional chest pain, palpitations, or syncope.  I reviewed her current medications, cardiac regimen is noted below.  She has an ACE inhibitor allergy and also history of hyperkalemia limiting treatment options.  Weight is down 7 pounds over the last 6 months, not on a standing diuretic.  Blood pressure also well controlled today.  I reviewed her recent lab work as noted below.  We did discuss a follow-up echocardiogram.  Past Medical History:  Diagnosis Date   Coronary atherosclerosis of native coronary artery    BMS to LAD 2011, residual RCA disease managed medically   Hyperkalemia on ACE inhibitors    Ischemic cardiomyopathy    LVEF 25-30% echo 2014   Myocardial infarction, anterior wall (HCC) 2011   Seizure disorder [facility])     Past Surgical History:  Procedure Laterality Date   NO PAST SURGERIES      Current Outpatient Medications  Medication Sig Dispense Refill   aspirin 81 MG tablet Take 81 mg by mouth daily.     carvedilol (COREG) 6.25 MG tablet Take 6.25 mg by mouth 2 (two) times daily.     nitroGLYCERIN (NITROSTAT) 0.4 MG SL tablet Place 1 tablet (0.4 mg total) under the tongue every 5 (five) minutes x 3 doses as needed (If no relief after 3rd dose, proceed to the ED for an evalution). 25 tablet 3   Omega-3 Fatty Acids (FISH OIL) 1000 MG CAPS Take 1 capsule by mouth daily.     PHENObarbital (LUMINAL) 30 MG tablet Take 30 mg by mouth 3 (three) times daily.     rosuvastatin (CRESTOR) 20 MG tablet Take 1  tablet (20 mg total) by mouth daily. 30 tablet 6   No current facility-administered medications for this visit.   Allergies:  Ace inhibitors   ROS: No orthopnea or PND.  Physical Exam: VS:  BP (!) 106/58   Pulse 64   Ht 5\' 2"  (1.575 m)   Wt 131 lb (59.4 kg)   SpO2 96%   BMI 23.96 kg/m , BMI Body mass index is 23.96 kg/m.  Wt Readings from Last 3 Encounters:  02/02/21 131 lb (59.4 kg)  06/30/20 138 lb (62.6 kg)  06/05/19 135 lb (61.2 kg)    General: Patient appears comfortable at rest. HEENT: Conjunctiva and lids normal, wearing a mask. Neck: Supple, no elevated JVP or carotid bruits, no thyromegaly. Lungs: Clear to auscultation, nonlabored breathing at rest. Cardiac: Regular rate and rhythm, no S3 or significant systolic murmur, no pericardial rub. Extremities: No pitting edema.  ECG:  An ECG dated 06/30/2020 was personally reviewed today and demonstrated:  Sinus bradycardia.  Recent Labwork:  June 2022: Hemoglobin 11.9, platelets 205, BUN 14, creatinine 0.74, potassium 4.8, AST 18, ALT 11, cholesterol 136, triglycerides 54, HDL 66, LDL 68, TSH 0.68  Other Studies Reviewed Today:  Echocardiogram 02/05/2018: - Left ventricle: The cavity size was mildly dilated. Wall    thickness was normal. Systolic function was moderately to    severely reduced.  The estimated ejection fraction was in the    range of 30% to 35%. There is akinesis of the    mid-apicalanteroseptal and apical myocardium. There is akinesis    of the apicalanterolateral myocardium. Features are consistent    with a pseudonormal left ventricular filling pattern, with    concomitant abnormal relaxation and increased filling pressure    (grade 2 diastolic dysfunction).  - Aortic valve: Mildly calcified annulus. Trileaflet. There was    trivial regurgitation.  - Mitral valve: Mildly calcified annulus. There was mild to    moderate regurgitation.  - Right atrium: Central venous pressure (est): 3 mm Hg.  -  Atrial septum: No defect or patent foramen ovale was identified.  - Tricuspid valve: There was moderate regurgitation.  - Pulmonary arteries: PA peak pressure: 39 mm Hg (S).  - Pericardium, extracardiac: There was no pericardial effusion.  Assessment and Plan:  1.  Ischemic cardiomyopathy with LVEF 30 to 35% by last assessment in 2019.  She has been clinically quite stable with no evidence of fluid overload and on limited therapy including aspirin, Coreg, and Crestor.  Blood pressure low normal today and weight down 7 pounds in the last 6 months.  She is not on standing diuretic therapy.  We have avoided Aldactone given history of hyperkalemia, also has an ACE inhibitor allergy.  Could possibly consider SGLT2 inhibitor.  We will follow-up with an echocardiogram for reassessment of LVEF.  She has declined ICD.  2.  CAD status post BMS to the LAD in 2011 with residual RCA disease that has been managed medically.  She does not report any angina symptoms at this time or nitroglycerin use.  Medication Adjustments/Labs and Tests Ordered: Current medicines are reviewed at length with the patient today.  Concerns regarding medicines are outlined above.   Tests Ordered: Orders Placed This Encounter  Procedures   ECHOCARDIOGRAM COMPLETE     Medication Changes: No orders of the defined types were placed in this encounter.   Disposition:  Follow up  6 months.  Signed, Jonelle Sidle, MD, [facility] 02/02/2021 2:40 PM    [facility] at [facility] [address] [place], [place] [address] Phone: [phone]; Fax: [phone]

## 2021-02-02 ENCOUNTER — Ambulatory Visit: Payer: Medicare Other | Admitting: Cardiology

## 2021-02-02 ENCOUNTER — Other Ambulatory Visit: Payer: Self-pay

## 2021-02-02 ENCOUNTER — Encounter: Payer: Self-pay | Admitting: Cardiology

## 2021-02-02 VITALS — BP 106/58 | HR 64 | Ht 62.0 in | Wt 131.0 lb

## 2021-02-02 DIAGNOSIS — I255 Ischemic cardiomyopathy: Secondary | ICD-10-CM

## 2021-02-02 DIAGNOSIS — I25119 Atherosclerotic heart disease of native coronary artery with unspecified angina pectoris: Secondary | ICD-10-CM

## 2021-02-02 NOTE — Patient Instructions (Signed)
Medication Instructions:  Your physician recommends that you continue on your current medications as directed. Please refer to the Current Medication list given to you today.  *If you need a refill on your cardiac medications before your next appointment, please call your pharmacy*   Lab Work: None today If you have labs (blood work) drawn today and your tests are completely normal, you will receive your results only by: . MyChart Message (if you have MyChart) OR . A paper copy in the mail If you have any lab test that is abnormal or we need to change your treatment, we will call you to review the results.   Testing/Procedures: Your physician has requested that you have an echocardiogram. Echocardiography is a painless test that uses sound waves to create images of your heart. It provides your doctor with information about the size and shape of your heart and how well your heart's chambers and valves are working. This procedure takes approximately one hour. There are no restrictions for this procedure.    Follow-Up: At [facility], you and your health needs are our priority.  As part of our continuing mission to provide you with exceptional heart care, we have created designated Provider Care Teams.  These Care Teams include your primary Cardiologist (physician) and Advanced Practice Providers (APPs -  Physician Assistants and Nurse Practitioners) who all work together to provide you with the care you need, when you need it.  We recommend signing up for the patient portal called "MyChart".  Sign up information is provided on this After Visit Summary.  MyChart is used to connect with patients for Virtual Visits (Telemedicine).  Patients are able to view lab/test results, encounter notes, upcoming appointments, etc.  Non-urgent messages can be sent to your provider as well.   To learn more about what you can do with MyChart, go to [email or web address].    Your next appointment:   6  month(s)  The format for your next appointment:   In Person  Provider:   Samuel McDowell, MD   Other Instructions None   

## 2021-02-15 ENCOUNTER — Ambulatory Visit (HOSPITAL_COMMUNITY)
Admission: RE | Admit: 2021-02-15 | Discharge: 2021-02-15 | Disposition: A | Discharge status: Home / Self Care | Payer: Medicare Other | Source: Ambulatory Visit | Attending: Cardiology | Admitting: Cardiology

## 2021-02-15 ENCOUNTER — Other Ambulatory Visit: Payer: Self-pay

## 2021-02-15 DIAGNOSIS — I255 Ischemic cardiomyopathy: Secondary | ICD-10-CM | POA: Insufficient documentation

## 2021-02-15 LAB — ECHOCARDIOGRAM COMPLETE
AR max vel: 2.25 cm2
AV Area VTI: 1.97 cm2
AV Area mean vel: 2.05 cm2
AV Mean grad: 3 mmHg
AV Peak grad: 5.2 mmHg
Ao pk vel: 1.14 m/s
Area-P 1/2: 3.12 cm2
Calc EF: 35.5 %
MV VTI: 1.53 cm2
S' Lateral: 3.55 cm
Single Plane A2C EF: 30.2 %
Single Plane A4C EF: 36.3 %

## 2021-02-15 NOTE — Progress Notes (Signed)
*  PRELIMINARY RESULTS* Echocardiogram 2D Echocardiogram has been performed.  Destiny Hoffman 02/15/2021, 3:03 PM

## 2021-02-18 ENCOUNTER — Telehealth: Payer: Self-pay | Admitting: *Deleted

## 2021-02-18 DIAGNOSIS — Z79899 Other long term (current) drug therapy: Secondary | ICD-10-CM

## 2021-02-18 DIAGNOSIS — I255 Ischemic cardiomyopathy: Secondary | ICD-10-CM

## 2021-02-18 NOTE — Telephone Encounter (Signed)
-----   Message from Jonelle Sidle, MD sent at 02/15/2021  5:12 PM EDT ----- Results reviewed.  LVEF stable in the range of 30 to 35% and consistent with her history of ischemic cardiomyopathy.  As per recent office note, consideration of Jardiance 10 mg daily if she is willing.  Cardiac regimen has otherwise been limited.  If [place] Pepper is started, would check BMET in one month.

## 2021-03-01 ENCOUNTER — Other Ambulatory Visit: Payer: Self-pay | Admitting: *Deleted

## 2021-03-01 DIAGNOSIS — Z79899 Other long term (current) drug therapy: Secondary | ICD-10-CM

## 2021-03-01 DIAGNOSIS — I255 Ischemic cardiomyopathy: Secondary | ICD-10-CM

## 2021-03-01 MED ORDER — EMPAGLIFLOZIN 10 MG PO TABS: 10.0000 mg | ORAL_TABLET | Freq: Every day | ORAL | 6 refills | Status: DC

## 2021-03-01 NOTE — Telephone Encounter (Signed)
Patient informed and verbalized understanding of plan. Lab order faxed to [facility]. 

## 2021-03-03 NOTE — Telephone Encounter (Signed)
Pt would like her lab work send to [facility] in [place]- she does not like [employer]

## 2021-05-09 ENCOUNTER — Telehealth: Payer: Self-pay | Admitting: *Deleted

## 2021-05-09 NOTE — Telephone Encounter (Signed)
Contacted to check status of bmet ordered after starting jardiance. Says she decided not to take jardiance because of all the side effects associated with it. Says she will discuss this with provider at follow up visit. Offered to schedule January 2023 visit and patient declined. Says she was busy and would call back to schedule if she decides to come back.

## 2021-05-30 DIAGNOSIS — I10 Essential (primary) hypertension: Secondary | ICD-10-CM | POA: Diagnosis not present

## 2021-05-30 DIAGNOSIS — E785 Hyperlipidemia, unspecified: Secondary | ICD-10-CM | POA: Diagnosis not present

## 2021-05-30 DIAGNOSIS — Z1329 Encounter for screening for other suspected endocrine disorder: Secondary | ICD-10-CM | POA: Diagnosis not present

## 2021-05-30 DIAGNOSIS — R7303 Prediabetes: Secondary | ICD-10-CM | POA: Diagnosis not present

## 2021-06-13 DIAGNOSIS — I2510 Atherosclerotic heart disease of native coronary artery without angina pectoris: Secondary | ICD-10-CM | POA: Diagnosis not present

## 2021-06-13 DIAGNOSIS — I10 Essential (primary) hypertension: Secondary | ICD-10-CM | POA: Diagnosis not present

## 2021-06-13 DIAGNOSIS — E559 Vitamin D deficiency, unspecified: Secondary | ICD-10-CM | POA: Diagnosis not present

## 2021-06-13 DIAGNOSIS — R7303 Prediabetes: Secondary | ICD-10-CM | POA: Diagnosis not present

## 2021-07-28 DIAGNOSIS — E559 Vitamin D deficiency, unspecified: Secondary | ICD-10-CM | POA: Diagnosis not present

## 2021-07-28 DIAGNOSIS — I10 Essential (primary) hypertension: Secondary | ICD-10-CM | POA: Diagnosis not present

## 2021-09-06 IMAGING — DX DG FOOT COMPLETE 3+V*R*
3 series · 3 of 3 positions shown · non-contrast
Comparison: None.

CLINICAL DATA: Right foot pain after fall several weeks ago.

EXAM:
RIGHT FOOT COMPLETE - 3+ VIEW

[foot ap]
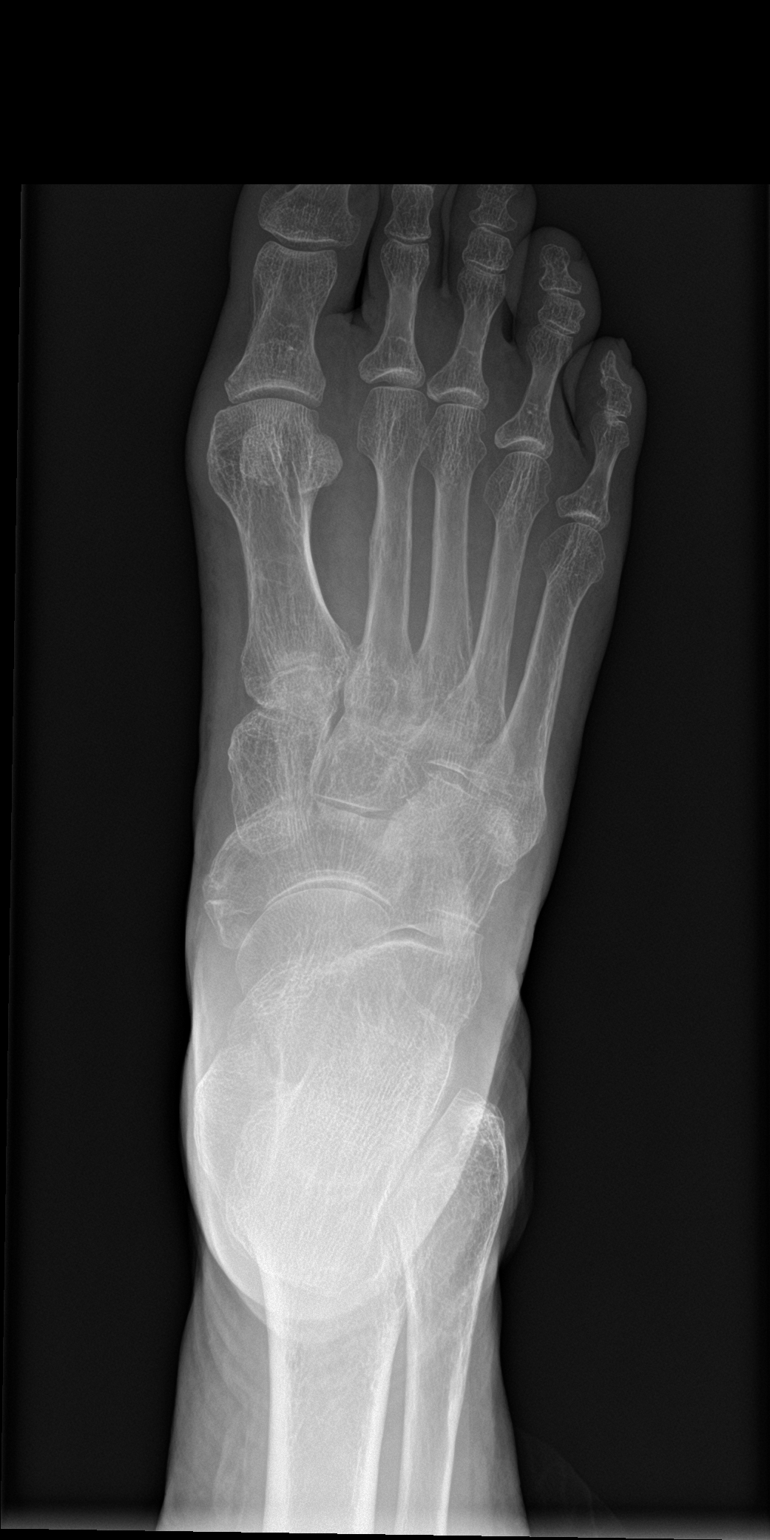

[foot obl]
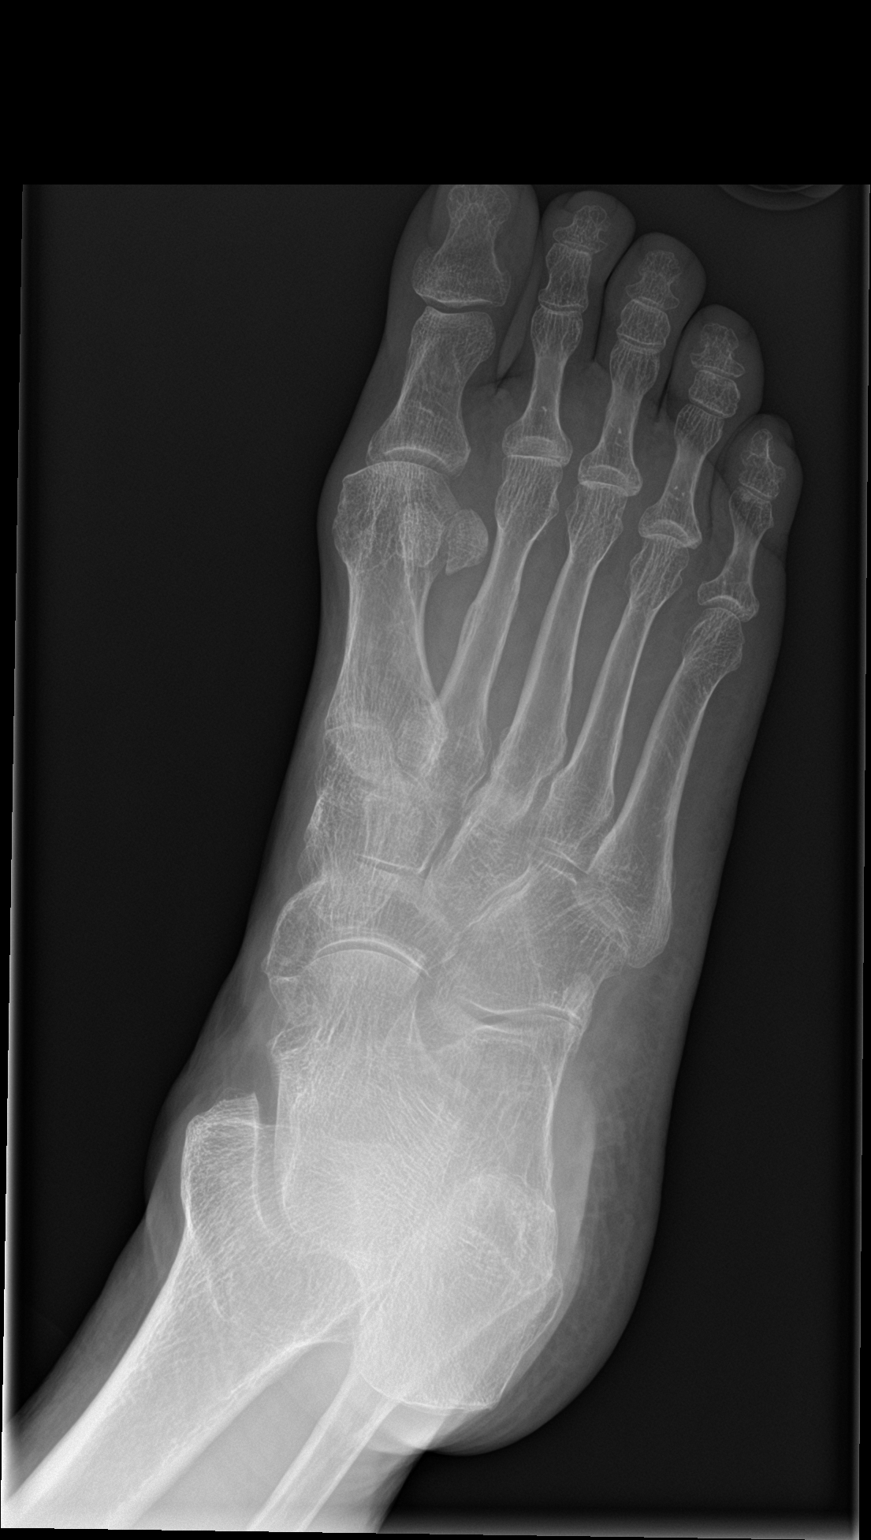

[foot lat]
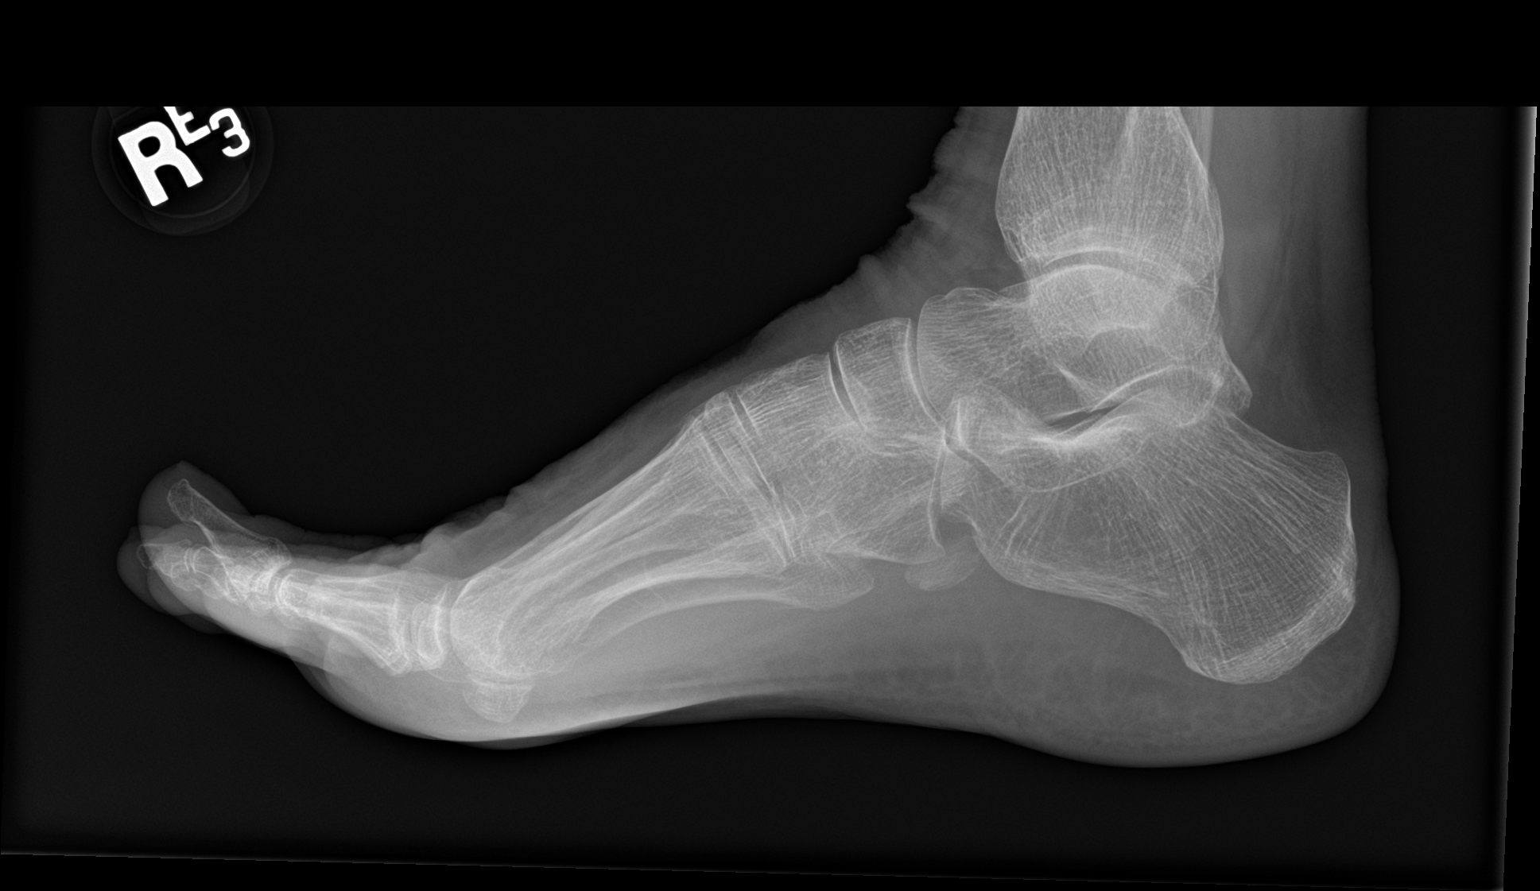

[3 of 3 positions shown; findings below may reference images not displayed]

FINDINGS: There appears to be a nondisplaced fracture involving the proximal
base of the first proximal phalanx. There is no evidence of
arthropathy or other focal bone abnormality. Soft tissues are
unremarkable.
IMPRESSION: Probable nondisplaced fracture involving proximal base of first
proximal phalanx.

## 2021-09-16 ENCOUNTER — Ambulatory Visit (INDEPENDENT_AMBULATORY_CARE_PROVIDER_SITE_OTHER): Payer: Medicare Other | Admitting: Physician Assistant

## 2021-09-16 ENCOUNTER — Encounter: Payer: Self-pay | Admitting: Physician Assistant

## 2021-09-16 VITALS — BP 110/60 | HR 64 | Ht 62.0 in | Wt 136.2 lb

## 2021-09-16 DIAGNOSIS — I2510 Atherosclerotic heart disease of native coronary artery without angina pectoris: Secondary | ICD-10-CM | POA: Diagnosis not present

## 2021-09-16 DIAGNOSIS — I255 Ischemic cardiomyopathy: Secondary | ICD-10-CM | POA: Diagnosis not present

## 2021-09-16 DIAGNOSIS — I10 Essential (primary) hypertension: Secondary | ICD-10-CM

## 2021-09-16 NOTE — Progress Notes (Signed)
Cardiology Office Note   Date:  09/16/2021   ID:  Destiny Hoffman, DOB Jan 23, 1946, MRN [id number]  PCP:  Pearson Grippe, MD Cardiologist:  Nona Dell, MD 02/02/2021 Electrphysiologist: None Theodore Demark, PA-C   No chief complaint on file.   History of Present Illness: Destiny Hoffman is a 76 y.o. female with a history of  MI s/p BMS LAD 2011 w/ small RCA 80% (med rx), ICM w/ EF 25-30%>> 30-35% 2020 echo, HTN, HLD, Sz, high K+ on ACE, COVID with acute pancreatitis 07/2020   Destiny Hoffman presents for cardiology follow up  She did not take the Jardiance because the side effects scared her.   Her son died 07/07/2021. This was very hard on her. She tries to keep busy. It is hardest at night, she will think about him and cry. She reads her bible and prays.  She keeps her house. She can take the trash bin to the road without too much trouble.   Her sleep is off, not sure what to do. She has not mentioned this to her PCP, says will make an appt.   She does not get chest pain w/ activity, but does get more tired than she used to. She is willing to increase her activity. It will be a little bit easier now that the weather is getting warmer.   She does not have problems w/ LE edema, no orthopnea. Does wake at night frequently, but does not think it is from SOB. Believes increased DOE is related to deconditioning w/ decreased activity for > 3 months.   Her husband has COPD and is under Hospice care. However, she can leave him for a few hours at a time.    Past Medical History:  Diagnosis Date   Coronary atherosclerosis of native coronary artery    BMS to LAD 2011, residual RCA disease managed medically   HTN (hypertension)    Hyperkalemia on ACE inhibitors    Hyperlipidemia LDL goal <70    Ischemic cardiomyopathy    LVEF 25-30% echo 2014   Myocardial infarction, anterior wall (HCC) 07/17/2009   Seizure disorder [facility])     Past Surgical History:  Procedure Laterality  Date   CORONARY ANGIOPLASTY WITH STENT PLACEMENT  2011   BMS LAD    Current Outpatient Medications  Medication Sig Dispense Refill   aspirin 81 MG tablet Take 81 mg by mouth daily.     carvedilol (COREG) 6.25 MG tablet Take 6.25 mg by mouth 2 (two) times daily.     nitroGLYCERIN (NITROSTAT) 0.4 MG SL tablet Place 1 tablet (0.4 mg total) under the tongue every 5 (five) minutes x 3 doses as needed (If no relief after 3rd dose, proceed to the ED for an evalution). 25 tablet 3   Omega-3 Fatty Acids (FISH OIL) 1000 MG CAPS Take 1 capsule by mouth daily.     PHENObarbital (LUMINAL) 30 MG tablet Take 30 mg by mouth 3 (three) times daily.     rosuvastatin (CRESTOR) 20 MG tablet Take 1 tablet (20 mg total) by mouth daily. 30 tablet 6   No current facility-administered medications for this visit.    Allergies:   Ace inhibitors    Social History:  The patient  reports that she quit smoking about 11 years ago. Her smoking use included cigarettes. She has never used smokeless tobacco. She reports that she does not drink alcohol and does not use drugs.   Family History:  The patient's family  history includes Heart attack in her father.  She indicated that the status of her father is unknown.    ROS:  Please see the history of present illness. All other systems are reviewed and negative.    PHYSICAL EXAM: VS:  BP 110/60    Pulse 64    Ht 5\' 2"  (1.575 m)    Wt 136 lb 3.2 oz (61.8 kg)    SpO2 99%    BMI 24.91 kg/m  , BMI Body mass index is 24.91 kg/m. GEN: Well nourished, well developed, female in no acute distress HEENT: normal for age  Neck: no JVD, no carotid bruit, no masses Cardiac: RRR; no murmur, no rubs, or gallops Respiratory:  clear to auscultation bilaterally, normal work of breathing GI: soft, nontender, nondistended, + BS MS: no deformity or atrophy; no edema; distal pulses are 2+ in all 4 extremities  Skin: warm and dry, no rash Neuro:  Strength and sensation are intact Psych:  euthymic mood, full affect   EKG:  EKG is ordered today. The ekg ordered today demonstrates SR, HR 65, Q waves V1-2  ECHO:  02/15/2021  1. Left ventricular ejection fraction, by estimation, is 30 to 35%. The  left ventricle has moderately decreased function. The left ventricle  demonstrates regional wall motion abnormalities (see scoring  diagram/findings for description). Left ventricular   diastolic parameters are consistent with Grade I diastolic dysfunction  (impaired relaxation). Calcified trabeculation near apex without obvious  thrombus.   2. Right ventricular systolic function is normal. The right ventricular  size is normal. There is normal pulmonary artery systolic pressure. The  estimated right ventricular systolic pressure is 28.6 mmHg.   3. Left atrial size was mildly dilated.   4. The mitral valve is abnormal. Mild mitral valve regurgitation. The  mean mitral valve gradient is 2.0 mmHg.   5. Tricuspid valve regurgitation is moderate.   6. The aortic valve is tricuspid. Aortic valve regurgitation is not  visualized. Aortic valve mean gradient measures 3.0 mmHg.   7. The inferior vena cava is normal in size with greater than 50%  respiratory variability, suggesting right atrial pressure of 3 mmHg.   CATH: 02/15/2020    ANGIOGRAPHIC DATA: 1. On plain fluoroscopy, there was moderate calcification particularly     in the LAD. 2. Ventriculography in the RAO projection reveals an anteroapical wall     motion abnormality with estimated ejection fraction of 35-40%.  The     segment is akinetic.  There is mild mitral regurgitation. 3. The right coronary artery is a smaller caliber vessel.  There was a     segmental lesion of about 80%, the mid vessel then providing a     posterior descending and posterolateral branch posteriorly. 4. The left main is free of critical disease. 5. The LAD courses to the apex just after the takeoff of the small     first diagonal, there is total  occlusion of the LAD.  This was a     somewhat stiff lesion, but ultimately opened nicely with a balloon     and stent was reduced to 0% residual luminal narrowing.  The vessel     tapers distally, but provides an apical vessel and then supplies a     significant portion of the distal inferior wall.  There is probably     about 50% narrowing at the ostium of the first diagonal and a     little bit of mild pinching of the  second diagonal, but no critical     stenoses. 6. The circumflex is a fairly large-caliber vessel, there are two     modest marginal branches followed by about a 30% area of stenosis.     The vessel then goes posteriorly.  There is a distal marginal     branch that has about a 50% area of stenosis distally as well.  No     critical lesions are noted.   CONCLUSIONS: 1. Moderately severe reduction in left ventricular function. 2. Total occlusion of the left anterior descending artery acutely with     successful reperfusion using a non-drug-eluting stent. 3. High-grade lesion of the mid right coronary artery. 4. Total balloon time of 20 minutes.  MONITOR: n/a   Recent Labs: No results found for requested labs within last 8760 hours.  CBC    Component Value Date/Time   WBC 7.9 05/20/2010 0612   RBC 3.86 (L) 05/20/2010 0612   HGB 11.9 (L) 05/20/2010 0612   HCT 37.1 05/20/2010 0612   PLT 186 05/20/2010 0612   MCV 96.1 05/20/2010 0612   MCH 30.8 05/20/2010 0612   MCHC 32.1 05/20/2010 0612   RDW 12.9 05/20/2010 0612   LYMPHSABS 3.0 03/07/2010 1334   MONOABS 0.9 03/07/2010 1334   EOSABS 0.3 03/07/2010 1334   BASOSABS 0.1 03/07/2010 1334   CMP Latest Ref Rng & Units 02/15/2012 04/10/2011 05/20/2010  Glucose 70 - 99 mg/dL - - 96  BUN 6 - 23 mg/dL - - 14  Creatinine 0.4 - 1.2 mg/dL - - 0.38  Sodium 882 - 145 mEq/L - - 143  Potassium 3.5 - 5.1 mEq/L - - 4.4 DELTA CHECK NOTED  Chloride 96 - 112 mEq/L - - 110  CO2 19 - 32 mEq/L - - 29  Calcium 8.4 - 10.5 mg/dL - - 9.4   Total Protein 6.0 - 8.3 g/dL 7.1 7.3 -  Total Bilirubin 0.3 - 1.2 mg/dL 0.4 0.4 -  Alkaline Phos 39 - 117 U/L 64 77 -  AST 0 - 37 U/L 18 27 -  ALT 0 - 35 U/L 15 19 -     Lipid Panel Lab Results  Component Value Date   CHOL 119 02/15/2012   HDL 66.70 02/15/2012   LDLCALC 38 02/15/2012   TRIG 74.0 02/15/2012   CHOLHDL 2 02/15/2012      Wt Readings from Last 3 Encounters:  09/16/21 136 lb 3.2 oz (61.8 kg)  02/02/21 131 lb (59.4 kg)  06/30/20 138 lb (62.6 kg)     Other studies Reviewed: Additional studies/ records that were reviewed today include: Office notes, hospital records and testing.  ASSESSMENT AND PLAN:  1.  CAD: - She has been under significant emotional stress recently and is deconditioned - She is not having any ischemic symptoms, no additional testing. - Continue her current medical therapy of aspirin 81 mg daily, Coreg 6.25 mg daily, Crestor 20 mg daily and as needed nitroglycerin.  2.  Deconditioning -She is encouraged to increase her activity gradually and let us know if she has any symptoms with this - She is encouraged not to do heavy work, but get her family and friends or church members to help out  3.  Ischemic cardiomyopathy - She does not have any signs of volume overload by exam. - Her weight is up 5 pounds in the last 10 months, but she admits to significantly decreased activity level - She is to continue to track her weight carefully and  maintain a low-sodium heart healthy diet -She did not actually try the Jardiance, but the side effect list scared her.  I suggested that when she is reading about side effects, she should look at the frequency of the side effects, especially the more serious ones to decide if they are actually going to happen to her or not  4.  Hypertension: - Her blood pressure is well controlled on current medications. -Continue Coreg 6.25 mg twice daily   Current medicines are reviewed at length with the patient today.  The  patient does not have concerns regarding medicines.  The following changes have been made:  no change  Labs/ tests ordered today include:   Orders Placed This Encounter  Procedures   EKG 12-Lead     Disposition:   FU with Nona DellSamuel McDowell, MD  Signed, Theodore Demarkhonda Chanta Bauers, PA-C  09/16/2021 3:10 PM    [facility] Phone: [phone]; Fax: [phone]

## 2021-09-16 NOTE — Patient Instructions (Addendum)
Medication Instructions:  ?Your physician recommends that you continue on your current medications as directed. Please refer to the Current Medication list given to you today. ? ?Labwork: ?None-please arrange to have fasting lipid and liver lab work done by your family doctor ? ?Testing/Procedures: ?none ? ?Follow-Up: ?Your physician recommends that you schedule a follow-up appointment in: 1 year. You will receive a reminder call in the mail in about 10 months reminding you to call and schedule your appointment. If you don't receive this call, please contact our office. ? ?Any Other Special Instructions Will Be Listed Below (If Applicable). ?Please restart exercising ? ?If you need a refill on your cardiac medications before your next appointment, please call your pharmacy. ?

## 2021-11-28 DIAGNOSIS — I10 Essential (primary) hypertension: Secondary | ICD-10-CM | POA: Diagnosis not present

## 2021-11-28 DIAGNOSIS — E559 Vitamin D deficiency, unspecified: Secondary | ICD-10-CM | POA: Diagnosis not present

## 2021-11-28 DIAGNOSIS — E785 Hyperlipidemia, unspecified: Secondary | ICD-10-CM | POA: Diagnosis not present

## 2021-12-13 DIAGNOSIS — G4089 Other seizures: Secondary | ICD-10-CM | POA: Diagnosis not present

## 2021-12-13 DIAGNOSIS — I10 Essential (primary) hypertension: Secondary | ICD-10-CM | POA: Diagnosis not present

## 2021-12-13 DIAGNOSIS — E559 Vitamin D deficiency, unspecified: Secondary | ICD-10-CM | POA: Diagnosis not present

## 2021-12-13 DIAGNOSIS — E785 Hyperlipidemia, unspecified: Secondary | ICD-10-CM | POA: Diagnosis not present

## 2022-01-15 ENCOUNTER — Encounter (HOSPITAL_COMMUNITY): Payer: Self-pay | Admitting: Emergency Medicine

## 2022-01-15 ENCOUNTER — Other Ambulatory Visit: Payer: Self-pay

## 2022-01-15 ENCOUNTER — Inpatient Hospital Stay (HOSPITAL_COMMUNITY)
Admission: EM | Admit: 2022-01-15 | Discharge: 2022-01-20 | DRG: 522 | Disposition: A | Discharge status: Skilled Nursing Facility | Payer: Medicare Other | Attending: Internal Medicine | Admitting: Internal Medicine

## 2022-01-15 ENCOUNTER — Emergency Department (HOSPITAL_COMMUNITY): Payer: Medicare Other

## 2022-01-15 DIAGNOSIS — Z7982 Long term (current) use of aspirin: Secondary | ICD-10-CM | POA: Diagnosis not present

## 2022-01-15 DIAGNOSIS — M1612 Unilateral primary osteoarthritis, left hip: Secondary | ICD-10-CM | POA: Diagnosis present

## 2022-01-15 DIAGNOSIS — S72002A Fracture of unspecified part of neck of left femur, initial encounter for closed fracture: Secondary | ICD-10-CM | POA: Diagnosis not present

## 2022-01-15 DIAGNOSIS — Z79899 Other long term (current) drug therapy: Secondary | ICD-10-CM | POA: Diagnosis not present

## 2022-01-15 DIAGNOSIS — W010XXA Fall on same level from slipping, tripping and stumbling without subsequent striking against object, initial encounter: Secondary | ICD-10-CM | POA: Diagnosis present

## 2022-01-15 DIAGNOSIS — D72829 Elevated white blood cell count, unspecified: Secondary | ICD-10-CM | POA: Diagnosis not present

## 2022-01-15 DIAGNOSIS — Z6824 Body mass index (BMI) 24.0-24.9, adult: Secondary | ICD-10-CM

## 2022-01-15 DIAGNOSIS — E440 Moderate protein-calorie malnutrition: Secondary | ICD-10-CM | POA: Insufficient documentation

## 2022-01-15 DIAGNOSIS — Z87891 Personal history of nicotine dependence: Secondary | ICD-10-CM

## 2022-01-15 DIAGNOSIS — Z8249 Family history of ischemic heart disease and other diseases of the circulatory system: Secondary | ICD-10-CM | POA: Diagnosis not present

## 2022-01-15 DIAGNOSIS — M25552 Pain in left hip: Secondary | ICD-10-CM | POA: Diagnosis not present

## 2022-01-15 DIAGNOSIS — I10 Essential (primary) hypertension: Secondary | ICD-10-CM

## 2022-01-15 DIAGNOSIS — I252 Old myocardial infarction: Secondary | ICD-10-CM

## 2022-01-15 DIAGNOSIS — S72032A Displaced midcervical fracture of left femur, initial encounter for closed fracture: Principal | ICD-10-CM | POA: Diagnosis present

## 2022-01-15 DIAGNOSIS — E876 Hypokalemia: Secondary | ICD-10-CM | POA: Diagnosis not present

## 2022-01-15 DIAGNOSIS — Z955 Presence of coronary angioplasty implant and graft: Secondary | ICD-10-CM | POA: Diagnosis not present

## 2022-01-15 DIAGNOSIS — Y92009 Unspecified place in unspecified non-institutional (private) residence as the place of occurrence of the external cause: Secondary | ICD-10-CM | POA: Diagnosis not present

## 2022-01-15 DIAGNOSIS — Z96642 Presence of left artificial hip joint: Secondary | ICD-10-CM | POA: Diagnosis not present

## 2022-01-15 DIAGNOSIS — W19XXXA Unspecified fall, initial encounter: Secondary | ICD-10-CM | POA: Diagnosis not present

## 2022-01-15 DIAGNOSIS — Z888 Allergy status to other drugs, medicaments and biological substances status: Secondary | ICD-10-CM

## 2022-01-15 DIAGNOSIS — R569 Unspecified convulsions: Secondary | ICD-10-CM

## 2022-01-15 DIAGNOSIS — I2510 Atherosclerotic heart disease of native coronary artery without angina pectoris: Secondary | ICD-10-CM | POA: Diagnosis not present

## 2022-01-15 DIAGNOSIS — E782 Mixed hyperlipidemia: Secondary | ICD-10-CM | POA: Diagnosis present

## 2022-01-15 DIAGNOSIS — I5042 Chronic combined systolic (congestive) and diastolic (congestive) heart failure: Secondary | ICD-10-CM | POA: Diagnosis not present

## 2022-01-15 DIAGNOSIS — I255 Ischemic cardiomyopathy: Secondary | ICD-10-CM | POA: Diagnosis not present

## 2022-01-15 DIAGNOSIS — Z471 Aftercare following joint replacement surgery: Secondary | ICD-10-CM | POA: Diagnosis not present

## 2022-01-15 DIAGNOSIS — I110 Hypertensive heart disease with heart failure: Secondary | ICD-10-CM | POA: Diagnosis not present

## 2022-01-15 DIAGNOSIS — G40909 Epilepsy, unspecified, not intractable, without status epilepticus: Secondary | ICD-10-CM | POA: Diagnosis present

## 2022-01-15 DIAGNOSIS — I509 Heart failure, unspecified: Secondary | ICD-10-CM | POA: Diagnosis not present

## 2022-01-15 DIAGNOSIS — Z01810 Encounter for preprocedural cardiovascular examination: Secondary | ICD-10-CM | POA: Diagnosis not present

## 2022-01-15 LAB — CBC WITH DIFFERENTIAL/PLATELET
Abs Immature Granulocytes: 0.04 10*3/uL (ref 0.00–0.07)
Basophils Absolute: 0 10*3/uL (ref 0.0–0.1)
Basophils Relative: 0 %
Eosinophils Absolute: 0.1 10*3/uL (ref 0.0–0.5)
Eosinophils Relative: 1 %
HCT: 38.5 % (ref 36.0–46.0)
Hemoglobin: 12.5 g/dL (ref 12.0–15.0)
Immature Granulocytes: 0 %
Lymphocytes Relative: 8 %
Lymphs Abs: 0.9 10*3/uL (ref 0.7–4.0)
MCH: 31.9 pg (ref 26.0–34.0)
MCHC: 32.5 g/dL (ref 30.0–36.0)
MCV: 98.2 fL (ref 80.0–100.0)
Monocytes Absolute: 0.6 10*3/uL (ref 0.1–1.0)
Monocytes Relative: 6 %
Neutro Abs: 9.5 10*3/uL — ABNORMAL HIGH (ref 1.7–7.7)
Neutrophils Relative %: 85 %
Platelets: 188 10*3/uL (ref 150–400)
RBC: 3.92 MIL/uL (ref 3.87–5.11)
RDW: 12.5 % (ref 11.5–15.5)
WBC: 11.1 10*3/uL — ABNORMAL HIGH (ref 4.0–10.5)
nRBC: 0 % (ref 0.0–0.2)

## 2022-01-15 LAB — BASIC METABOLIC PANEL
Anion gap: 7 (ref 5–15)
BUN: 11 mg/dL (ref 8–23)
CO2: 26 mmol/L (ref 22–32)
Calcium: 9.3 mg/dL (ref 8.9–10.3)
Chloride: 106 mmol/L (ref 98–111)
Creatinine, Ser: 0.56 mg/dL (ref 0.44–1.00)
GFR, Estimated: >60 mL/min (ref >60)
Glucose, Bld: 120 mg/dL — ABNORMAL HIGH (ref 70–99)
Potassium: 4.5 mmol/L (ref 3.5–5.1)
Sodium: 139 mmol/L (ref 135–145)

## 2022-01-15 LAB — TYPE AND SCREEN
ABO/RH(D): O POS
Antibody Screen: NEGATIVE

## 2022-01-15 LAB — PROTIME-INR
INR: 1 (ref 0.8–1.2)
Prothrombin Time: 12.8 seconds (ref 11.4–15.2)

## 2022-01-15 LAB — URINALYSIS, ROUTINE W REFLEX MICROSCOPIC
Bacteria, UA: NONE SEEN
Bilirubin Urine: NEGATIVE
Glucose, UA: NEGATIVE mg/dL
Ketones, ur: 5 mg/dL — AB
Leukocytes,Ua: NEGATIVE
Nitrite: NEGATIVE
Protein, ur: NEGATIVE mg/dL
Specific Gravity, Urine: 1.004 — ABNORMAL LOW (ref 1.005–1.030)
pH: 7 (ref 5.0–8.0)

## 2022-01-15 MED ORDER — HYDROCODONE-ACETAMINOPHEN 5-325 MG PO TABS
1.0000 | ORAL_TABLET | Freq: Four times a day (QID) | ORAL | Status: DC | PRN
  Administered 2022-01-16 (×2): 1 via ORAL
  Administered 2022-01-17: 2 via ORAL
  Administered 2022-01-17 (×2): 1 via ORAL
  Administered 2022-01-18 – 2022-01-19 (×3): 2 via ORAL
  Filled 2022-01-15: qty 2
  Filled 2022-01-15: qty 1
  Filled 2022-01-15 (×5): qty 2

## 2022-01-15 MED ORDER — MORPHINE SULFATE (PF) 2 MG/ML IV SOLN
0.5000 mg | INTRAVENOUS | Status: DC | PRN
  Administered 2022-01-15 – 2022-01-19 (×5): 0.5 mg via INTRAVENOUS
  Filled 2022-01-15 (×5): qty 1

## 2022-01-15 MED ORDER — FENTANYL CITRATE PF 50 MCG/ML IJ SOSY
25.0000 ug | PREFILLED_SYRINGE | Freq: Once | INTRAMUSCULAR | Status: AC
  Administered 2022-01-15: 25 ug via INTRAVENOUS
  Filled 2022-01-15: qty 1

## 2022-01-15 NOTE — H&P (Signed)
History and Physical    Patient: Destiny Hoffman FMB:[id number] DOB: August 01, 1945 DOA: 01/15/2022 DOS: the patient was seen and examined on 01/15/2022 PCP: Pearson Grippe, MD  Patient coming from: Home  Chief Complaint:  Chief Complaint  Patient presents with   Fall   HPI: Destiny Hoffman is a 76 y.o. female with medical history significant of hypertension, hyperlipidemia, seizure, CAD s/p stent placement and HFrEF who presents to the emergency department via EMS from home after sustaining a fall.  Patient states that she accidentally tripped on something at home and fell from standing position landing on her left hip, she was able to get her up after the fall though she complained of left hip pain, she went to bed to nap, but she continued to have increased pain and discomfort in the hip area.  Patient also required help from husband to be able to get up from bed (different from baseline), so, EMS was activated and patient was sent to the ED for further evaluation and management.  Patient denies hitting her head, denies loss of consciousness, chest pain, shortness of breath, nausea, vomiting or abdominal pain  ED Course:  In the emergency department, intermittently tachypneic and tachycardic.  BP was 173/97 and O2 sat was 99% on room air on arrival to the ED.  Work-up in the ED showed normal CBC except for WBC of 11.1 and normal BMP except for CBG of 120.  Urinalysis was unimpressive for UTI. Left hip x-ray showed acute minimally impacted left femoral neck fracture Left knee x-ray showed no acute displaced fracture or dislocation She was treated with IV fentanyl 25 mcg x 1. Dr. Charlann Boxer of orthopedic surgery was consulted and recommended admitting patient to [facility] or [facility] for surgery.  Hospitalist was asked to admit patient for further evaluation and management.  Review of Systems: Review of systems as noted in the HPI. All other systems reviewed and are negative.   Past Medical  History:  Diagnosis Date   Coronary atherosclerosis of native coronary artery    BMS to LAD 2011, residual RCA disease managed medically   HTN (hypertension)    Hyperkalemia on ACE inhibitors    Hyperlipidemia LDL goal <70    Ischemic cardiomyopathy    LVEF 25-30% echo 2014   Myocardial infarction, anterior wall (HCC) 07/17/2009   Seizure disorder (HCC)    Past Surgical History:  Procedure Laterality Date   CORONARY ANGIOPLASTY WITH STENT PLACEMENT  2011   BMS LAD    Social History:  reports that she quit smoking about 11 years ago. Her smoking use included cigarettes. She has never used smokeless tobacco. She reports that she does not drink alcohol and does not use drugs.   Allergies  Allergen Reactions   Ace Inhibitors     Family History  Problem Relation Age of Onset   Heart attack Father        Died age 31     Prior to Admission medications   Medication Sig Start Date End Date Taking? Authorizing Provider  aspirin 81 MG tablet Take 81 mg by mouth daily.    [provider]  carvedilol (COREG) 6.25 MG tablet Take 6.25 mg by mouth 2 (two) times daily. 06/04/19   [provider]  nitroGLYCERIN (NITROSTAT) 0.4 MG SL tablet Place 1 tablet (0.4 mg total) under the tongue every 5 (five) minutes x 3 doses as needed (If no relief after 3rd dose, proceed to the ED for an evalution). 01/11/18  Jonelle Sidle, MD  Omega-3 Fatty Acids (FISH OIL) 1000 MG CAPS Take 1 capsule by mouth daily.    [provider]  PHENObarbital (LUMINAL) 30 MG tablet Take 30 mg by mouth 3 (three) times daily.    [provider]  rosuvastatin (CRESTOR) 20 MG tablet Take 1 tablet (20 mg total) by mouth daily. 01/25/15   Jonelle Sidle, MD    Physical Exam: BP (!) 164/78   Pulse (!) 103   Temp 98.4 F (36.9 C) (Oral)   Resp 12   Ht 5\' 2"  (1.575 m)   Wt 59.9 kg   SpO2 97%   BMI 24.14 kg/m   General: 76 y.o. year-old female well developed well nourished in  no acute distress.  Alert and oriented x3. HEENT: NCAT, EOMI Neck: Supple, trachea medial Cardiovascular: Regular rate and rhythm with no rubs or gallops.  No thyromegaly or JVD noted.  No lower extremity edema. 2/4 pulses in all 4 extremities. Respiratory: Clear to auscultation with no wheezes or rales. Good inspiratory effort. Abdomen: Soft, nontender nondistended with normal bowel sounds x4 quadrants. Muskuloskeletal: Tender to palpation of left hip, decreased ROM of left leg due to hip pain, nontender left knee.  +2 dorsalis pedis pulse bilaterally.   Neuro: CN II-XII intact, sensation, reflexes intact Skin: No ulcerative lesions noted or rashes Psychiatry: Judgement and insight appear normal. Mood is appropriate for condition and setting          Labs on Admission:  Basic Metabolic Panel: Recent Labs  Lab 01/15/22 1916  NA 139  K 4.5  CL 106  CO2 26  GLUCOSE 120*  BUN 11  CREATININE 0.56  CALCIUM 9.3   Liver Function Tests: No results for input(s): "AST", "ALT", "ALKPHOS", "BILITOT", "PROT", "ALBUMIN" in the last 168 hours. No results for input(s): "LIPASE", "AMYLASE" in the last 168 hours. No results for input(s): "AMMONIA" in the last 168 hours. CBC: Recent Labs  Lab 01/15/22 1916  WBC 11.1*  NEUTROABS 9.5*  HGB 12.5  HCT 38.5  MCV 98.2  PLT 188   Cardiac Enzymes: No results for input(s): "CKTOTAL", "CKMB", "CKMBINDEX", "TROPONINI" in the last 168 hours.  BNP (last 3 results) No results for input(s): "BNP" in the last 8760 hours.  ProBNP (last 3 results) No results for input(s): "PROBNP" in the last 8760 hours.  CBG: No results for input(s): "GLUCAP" in the last 168 hours.  Radiological Exams on Admission: CT HIP LEFT WO CONTRAST  Result Date: 01/15/2022 CLINICAL DATA:  Left hip fracture EXAM: CT OF THE LEFT HIP WITHOUT CONTRAST TECHNIQUE: Multidetector CT imaging of the left hip was performed according to the standard protocol. Multiplanar CT image  reconstructions were also generated. RADIATION DOSE REDUCTION: This exam was performed according to the departmental dose-optimization program which includes automated exposure control, adjustment of the mA and/or kV according to patient size and/or use of iterative reconstruction technique. COMPARISON:  Same-day x-ray FINDINGS: Bones/Joint/Cartilage Acute transcervical left femoral neck fracture with mild impaction. No significant displacement or angulation. Fracture extends to the lateral most margin of the femoral head which may involve the articular surface (series 6, image 59). No fracture involvement of the intertrochanteric region. Hip joint alignment is maintained without dislocation. Joint space is relatively preserved with mild marginal osteophyte formation. Moderate-sized hip joint effusion. Included portion of the left hemipelvis is intact. No additional fractures are seen. No pelvic diastasis. Ligaments Suboptimally assessed by CT. Muscles and Tendons No acute musculotendinous abnormality by CT.  Soft tissues No hematoma within the soft tissues. No left inguinal lymphadenopathy. Diverticular changes of the sigmoid colon. No acute findings within the left hemipelvis. IMPRESSION: 1. Acute transcervical left femoral neck fracture with mild impaction. Fracture extends to the lateral most margin of the femoral head which may involve the articular surface. 2. Moderate-sized hip joint effusion. Electronically Signed   By: Duanne Guess D.O.   On: 01/15/2022 21:36   DG Hip Unilat W or Wo Pelvis 2-3 Views Left  Result Date: 01/15/2022 CLINICAL DATA:  Left hip pain.  Fall EXAM: DG HIP (WITH OR WITHOUT PELVIS) 2-3V LEFT COMPARISON:  None Available. FINDINGS: Acute minimally impacted left femoral neck fracture. No significant displacement or angulation. Bones are demineralized. No dislocation. IMPRESSION: Acute minimally impacted left femoral neck fracture. Electronically Signed   By: Duanne Guess D.O.    On: 01/15/2022 18:45   DG Knee Complete 4 Views Left  Result Date: 01/15/2022 CLINICAL DATA:  left hip pain EXAM: LEFT KNEE - COMPLETE 4+ VIEW COMPARISON:  None Available. FINDINGS: Decreased bone density. No evidence of fracture, dislocation, or joint effusion. No evidence of arthropathy or other focal bone abnormality. Soft tissues are unremarkable. IMPRESSION: No acute displaced fracture or dislocation. Electronically Signed   By: Tish Frederickson M.D.   On: 01/15/2022 18:45    EKG: I independently viewed the EKG done and my findings are as followed: Normal sinus rhythm at a rate of 94 bpm  Assessment/Plan Present on Admission:  Fracture of femoral neck, left, closed (HCC)  Mixed hyperlipidemia  Coronary atherosclerosis of native coronary artery  Principal Problem:   Fracture of femoral neck, left, closed (HCC) Active Problems:   Mixed hyperlipidemia   Coronary atherosclerosis of native coronary artery   Seizure (HCC)   Fall at home, initial encounter   Chronic combined systolic and diastolic CHF (congestive heart failure) (HCC)   Essential hypertension  Left femoral neck fracture s/p fall at home  Left hip x-ray showed acute minimally impacted left femoral neck fracture Continue Norco as needed for moderate pain Continue morphine as needed for severe pain Continue fall precaution and neurochecks N.p.o. at midnight Orthopedic surgeon (Dr. Charlann Boxer) was consulted and recommended admitting patient to [facility] for anticipated surgical intervention per [facility] ED physician Consider PT/OT eval and treat status post surgical intervention  Essential hypertension Continue Coreg  Mixed hyperlipidemia Continue Lovaza and Crestor  Seizures Continue phenobarbital  CAD status post stent placement Continue aspirin, Coreg, Nitrostat, Crestor  Combined chronic HFrEF and HFpEF LVEF on 02/15/2021 showed EF of 30 to 35%.  LV has moderately decreased function.  RWMA.  G1 DD.  MV is abnormal.  Mild MVR.   Moderate TVR. Continue aspirin, Coreg, Crestor  DVT prophylaxis: SCDs  Code Status: Full code  Consults: Orthopedic surgery  Family Communication: None at bedside  Severity of Illness: The appropriate patient status for this patient is INPATIENT. Inpatient status is judged to be reasonable and necessary in order to provide the required intensity of service to ensure the patient's safety. The patient's presenting symptoms, physical exam findings, and initial radiographic and laboratory data in the context of their chronic comorbidities is felt to place them at high risk for further clinical deterioration. Furthermore, it is not anticipated that the patient will be medically stable for discharge from the hospital within 2 midnights of admission.   * I certify that at the point of admission it is my clinical judgment that the patient will require inpatient hospital care spanning  beyond 2 midnights from the point of admission due to high intensity of service, high risk for further deterioration and high frequency of surveillance required.*  Author: Frankey Shown, DO 01/15/2022 10:02 PM  For on call review [email or web address].

## 2022-01-15 NOTE — ED Notes (Signed)
Gave pt snack and ginerale.

## 2022-01-15 NOTE — ED Notes (Signed)
Pt to CT

## 2022-01-15 NOTE — ED Provider Notes (Signed)
[facility] EMERGENCY DEPARTMENT Provider Note   CSN: [id number] Arrival date & time: 01/15/22  1551     History Chief Complaint  Patient presents with   Destiny Hoffman is a 76 y.o. female patient who presents to the emergency department after mechanical trip and fall that occurred this morning.  Patient was getting ready for church and upon turning she lost her balance and fell onto her left hip.  Since then she is been having left hip and left knee pain.  Patient was ambulatory after the incident and went to take a nap this afternoon and woke up with severe pain.  She denies any other injury, LOC, head injury.   Fall       Home Medications Prior to Admission medications   Medication Sig Start Date End Date Taking? Authorizing Provider  aspirin 81 MG tablet Take 81 mg by mouth daily.    [provider]  carvedilol (COREG) 6.25 MG tablet Take 6.25 mg by mouth 2 (two) times daily. 06/04/19   [provider]  nitroGLYCERIN (NITROSTAT) 0.4 MG SL tablet Place 1 tablet (0.4 mg total) under the tongue every 5 (five) minutes x 3 doses as needed (If no relief after 3rd dose, proceed to the ED for an evalution). 01/11/18   Jonelle Sidle, MD  Omega-3 Fatty Acids (FISH OIL) 1000 MG CAPS Take 1 capsule by mouth daily.    [provider]  PHENObarbital (LUMINAL) 30 MG tablet Take 30 mg by mouth 3 (three) times daily.    [provider]  rosuvastatin (CRESTOR) 20 MG tablet Take 1 tablet (20 mg total) by mouth daily. 01/25/15   Jonelle Sidle, MD      Allergies    Ace inhibitors    Review of Systems   Review of Systems  All other systems reviewed and are negative.   Physical Exam Updated Vital Signs BP (!) 164/78   Pulse (!) 103   Temp 98.4 F (36.9 C) (Oral)   Resp 12   Ht 5\' 2"  (1.575 m)   Wt 59.9 kg   SpO2 97%   BMI 24.14 kg/m  Physical Exam Vitals and nursing note reviewed.  Constitutional:      Appearance: Normal  appearance.  HENT:     Head: Normocephalic and atraumatic.  Eyes:     General:        Right eye: No discharge.        Left eye: No discharge.     Conjunctiva/sclera: Conjunctivae normal.  Pulmonary:     Effort: Pulmonary effort is normal.  Musculoskeletal:     Comments: There is tenderness to palpation over the left hip anteriorly and laterally.  Legs are symmetrical in length.  2+ results pedis pulse felt bilaterally.  Neurologically intact.  Left knee is nontender to palpation.  Skin:    General: Skin is warm and dry.     Findings: No rash.  Neurological:     General: No focal deficit present.     Mental Status: She is alert.  Psychiatric:        Mood and Affect: Mood normal.        Behavior: Behavior normal.     ED Results / Procedures / Treatments   Labs (all labs ordered are listed, but only abnormal results are displayed) Labs Reviewed  BASIC METABOLIC PANEL  CBC WITH DIFFERENTIAL/PLATELET  PROTIME-INR  TYPE AND SCREEN    EKG None  Radiology DG  Hip Unilat W or Wo Pelvis 2-3 Views Left  Result Date: 01/15/2022 CLINICAL DATA:  Left hip pain.  Fall EXAM: DG HIP (WITH OR WITHOUT PELVIS) 2-3V LEFT COMPARISON:  None Available. FINDINGS: Acute minimally impacted left femoral neck fracture. No significant displacement or angulation. Bones are demineralized. No dislocation. IMPRESSION: Acute minimally impacted left femoral neck fracture. Electronically Signed   By: Duanne Guess D.O.   On: 01/15/2022 18:45   DG Knee Complete 4 Views Left  Result Date: 01/15/2022 CLINICAL DATA:  left hip pain EXAM: LEFT KNEE - COMPLETE 4+ VIEW COMPARISON:  None Available. FINDINGS: Decreased bone density. No evidence of fracture, dislocation, or joint effusion. No evidence of arthropathy or other focal bone abnormality. Soft tissues are unremarkable. IMPRESSION: No acute displaced fracture or dislocation. Electronically Signed   By: Tish Frederickson M.D.   On: 01/15/2022 18:45     Procedures Procedures    Medications Ordered in ED Medications  fentaNYL (SUBLIMAZE) injection 25 mcg (25 mcg Intravenous Given 01/15/22 1706)    ED Course/ Medical Decision Making/ A&P Clinical Course as of 01/15/22 1930  Sun Jan 15, 2022  1849 DG Hip Unilat W or Wo Pelvis 2-3 Views Left Left hip x-ray reveals impacted femoral neck fracture.  I personally reviewed this image.  I do agree with radiologist interpretation.  I am going to consult orthopedics for further evaluation and management. [CF]  1849 DG Knee Complete 4 Views Left I personally reviewed the image of the left knee which did not reveal any fracture or dislocation.  I do agree with the radiologist interpretation. [CF]  1927 I spoke with Dr. Charlann Boxer with orthopedics who recommended getting a CT of the left hip to further evaluate and admit to the hospital service.  She will need to be transferred to [facility] or [facility] sometime tomorrow. [CF]    Clinical Course User Index [CF] Teressa Lower, PA-C                           Medical Decision Making [name] is a 76 y.o. female patient who presents to the emergency department for further evaluation of left hip and left knee pain after a mechanical fall.  Patient is legs are symmetric and not rotated internally or externally to make me think this is either a dislocation or fracture.  Left knee is nontender and has no swelling.  But given her age and mechanism of fall we will get x-rays of the left hip and left knee to further evaluate.  Patient will be getting some pain medication to help control her pain in the meantime.  We will plan to reassess.   Amount and/or Complexity of Data Reviewed Labs: ordered. Radiology: ordered. Decision-making details documented in ED Course.  Risk Prescription drug management. Risk Details: Given the impacted femoral neck fracture on the left, patient will need to be admitted for surgical evaluation.  I spoke with Dr. Charlann Boxer  which is highlighted in the ED course.  After we get labs back we will plan to get her admitted to the hospital service and ultimately transfer her down to [facility] or [facility] for surgery. Dr. Con Memos to follow up with hospitalist after blood work returns.    Final Clinical Impression(s) / ED Diagnoses Final diagnoses:  Fall, initial encounter  Closed fracture of neck of left femur, initial encounter (HCC)    Rx / DC Orders ED Discharge Orders  None         Honor Loh [place], [place] 01/15/22 1930    Jacalyn Lefevre, MD 01/15/22 [phone]

## 2022-01-15 NOTE — ED Triage Notes (Signed)
Pt tripped and fell this morning prior to church around 930 and landed on left leg.  Pain on left groin and thigh. No deformities noted

## 2022-01-16 DIAGNOSIS — I5042 Chronic combined systolic (congestive) and diastolic (congestive) heart failure: Secondary | ICD-10-CM | POA: Diagnosis not present

## 2022-01-16 DIAGNOSIS — I2510 Atherosclerotic heart disease of native coronary artery without angina pectoris: Secondary | ICD-10-CM | POA: Diagnosis not present

## 2022-01-16 DIAGNOSIS — E876 Hypokalemia: Secondary | ICD-10-CM

## 2022-01-16 DIAGNOSIS — I10 Essential (primary) hypertension: Secondary | ICD-10-CM | POA: Diagnosis not present

## 2022-01-16 DIAGNOSIS — S72002A Fracture of unspecified part of neck of left femur, initial encounter for closed fracture: Secondary | ICD-10-CM | POA: Diagnosis not present

## 2022-01-16 LAB — CBC
HCT: 36.4 % (ref 36.0–46.0)
Hemoglobin: 11.6 g/dL — ABNORMAL LOW (ref 12.0–15.0)
MCH: 31.2 pg (ref 26.0–34.0)
MCHC: 31.9 g/dL (ref 30.0–36.0)
MCV: 97.8 fL (ref 80.0–100.0)
Platelets: 183 10*3/uL (ref 150–400)
RBC: 3.72 MIL/uL — ABNORMAL LOW (ref 3.87–5.11)
RDW: 12.7 % (ref 11.5–15.5)
WBC: 9.8 10*3/uL (ref 4.0–10.5)
nRBC: 0 % (ref 0.0–0.2)

## 2022-01-16 LAB — BASIC METABOLIC PANEL
Anion gap: 8 (ref 5–15)
BUN: 11 mg/dL (ref 8–23)
CO2: 22 mmol/L (ref 22–32)
Calcium: 8.6 mg/dL — ABNORMAL LOW (ref 8.9–10.3)
Chloride: 106 mmol/L (ref 98–111)
Creatinine, Ser: 0.57 mg/dL (ref 0.44–1.00)
GFR, Estimated: >60 mL/min (ref >60)
Glucose, Bld: 102 mg/dL — ABNORMAL HIGH (ref 70–99)
Potassium: 3.4 mmol/L — ABNORMAL LOW (ref 3.5–5.1)
Sodium: 136 mmol/L (ref 135–145)

## 2022-01-16 MED ORDER — PHENOBARBITAL 20 MG/5ML PO ELIX
30.0000 mg | ORAL_SOLUTION | Freq: Three times a day (TID) | ORAL | Status: DC
  Administered 2022-01-16 – 2022-01-20 (×12): 30 mg via ORAL
  Filled 2022-01-16 (×12): qty 7.5

## 2022-01-16 MED ORDER — ASPIRIN 81 MG PO TBEC
81.0000 mg | DELAYED_RELEASE_TABLET | Freq: Every day | ORAL | Status: DC
Start: 2022-01-16 — End: 2022-01-18
  Administered 2022-01-16 – 2022-01-17 (×2): 81 mg via ORAL
  Filled 2022-01-16 (×2): qty 1

## 2022-01-16 MED ORDER — CARVEDILOL 6.25 MG PO TABS
6.2500 mg | ORAL_TABLET | Freq: Two times a day (BID) | ORAL | Status: DC
  Administered 2022-01-16 – 2022-01-20 (×9): 6.25 mg via ORAL
  Filled 2022-01-16 (×6): qty 1
  Filled 2022-01-16: qty 2
  Filled 2022-01-16 (×2): qty 1

## 2022-01-16 MED ORDER — ROSUVASTATIN CALCIUM 20 MG PO TABS
20.0000 mg | ORAL_TABLET | Freq: Every day | ORAL | Status: DC
  Administered 2022-01-16 – 2022-01-20 (×4): 20 mg via ORAL
  Filled 2022-01-16 (×3): qty 1

## 2022-01-16 MED ORDER — NITROGLYCERIN 0.4 MG SL SUBL: 0.4000 mg | SUBLINGUAL_TABLET | SUBLINGUAL | Status: DC | PRN

## 2022-01-16 MED ORDER — POTASSIUM CHLORIDE 10 MEQ/100ML IV SOLN
10.0000 meq | INTRAVENOUS | Status: AC
  Administered 2022-01-16 (×2): 10 meq via INTRAVENOUS
  Filled 2022-01-16 (×2): qty 100

## 2022-01-16 MED ORDER — PHENOBARBITAL 30 MG PO TABS: 30.0000 mg | ORAL_TABLET | Freq: Three times a day (TID) | ORAL | Status: DC

## 2022-01-16 MED ORDER — OMEGA-3-ACID ETHYL ESTERS 1 G PO CAPS
1.0000 g | ORAL_CAPSULE | Freq: Every day | ORAL | Status: DC
  Administered 2022-01-16 – 2022-01-20 (×4): 1 g via ORAL
  Filled 2022-01-16 (×4): qty 1

## 2022-01-16 NOTE — Progress Notes (Signed)
Paged Admitting to inform them that the patient arrived to the unit from [facility] and needs to be admitted.

## 2022-01-16 NOTE — Assessment & Plan Note (Signed)
Continue statin. 

## 2022-01-16 NOTE — Assessment & Plan Note (Signed)
PT/OT after operative repair

## 2022-01-16 NOTE — Assessment & Plan Note (Signed)
No chest pain presently Personally reviewed EKG--sinus rhythm, no ST-T wave change Continue aspirin Continue carvedilol

## 2022-01-16 NOTE — ED Notes (Signed)
Patient repositioned in bed and provided with new chuck for bed.

## 2022-01-16 NOTE — Progress Notes (Signed)
PROGRESS NOTE  Destiny Hoffman GEX:[id number] DOB: Apr 09, 1946 DOA: 01/15/2022 PCP: Pearson Grippe, MD  Brief History:  76 year old female with a history of coronary artery disease, MI s/p BMS LAD 2011 w/ small RCA 80% (med rx), HFrEF, hypertension, hyperlipidemia, seizure disorder presenting after mechanical fall.  The patient tripped over her cot at home landing on her left side.  She was able to get up but complained of left hip pain.  She took a nap after which she had difficulty getting out of bed because of pain.  This is new as the patient is completely independent prior to her fall.  There was no syncope or trauma to her head.  EMS was activated. The patient had been in her usual state of health prior to the fall.  Patient denies fevers, chills, headache, chest pain, dyspnea, nausea, vomiting, diarrhea, abdominal pain, dysuria, hematuria, hematochezia, and melena. CT of the left hip showed left femoral neck fracture with mild impaction.  Orthopedics, Dr. Cyndie Chime was consulted and requested transfer to [facility].  In the ED, the patient was afebrile hemodynamically stable.  WBC 11.1, hemoglobin 12.5, platelets 180,000.  Sodium 136, potassium 3.4, bicarbonate 22, serum creatinine 0.57.  EKG showed normal sinus rhythm.  There is no ST-T wave changes.    Assessment and Plan: * Fracture of femoral neck, left, closed (HCC) Judicious opioids Dr. Charlann Boxer consulted>>transfer to [facility] for operative repair PT/OT after operative repair  Essential hypertension Continue carvedilol  Chronic combined systolic and diastolic CHF (congestive heart failure) (HCC) Appears clinically euvolemic/compensated 8222 echo EF 30-35%, grade 1 DD, moderate TR Continue carvedilol  Fall at home, initial encounter PT/OT after operative repair  Seizure (HCC) Continue phenobarbital  Coronary atherosclerosis of native coronary artery No chest pain presently Personally reviewed EKG--sinus rhythm, no ST-T wave  change Continue aspirin Continue carvedilol  Mixed hyperlipidemia Continue statin     Family Communication:   no Family at bedside  Consultants:  ortho--Olin  Code Status:  FULL   DVT Prophylaxis:  start after surgery   Procedures: As Listed in Progress Note Above  Antibiotics: None       Subjective: Patient denies fevers, chills, headache, chest pain, dyspnea, nausea, vomiting, diarrhea, abdominal pain, dysuria, hematuria, hematochezia, and melena.   Objective: Vitals:   01/16/22 0515 01/16/22 0600 01/16/22 0645 01/16/22 0700  BP:  131/66  (!) 122/56  Pulse:  93 92 92  Resp:  (!) 25 (!) 24 (!) 24  Temp:      TempSrc:      SpO2: 92% 92% 95% 91%  Weight:      Height:        Intake/Output Summary (Last 24 hours) at 01/16/2022 0715 Last data filed at 01/16/2022 0200 Gross per 24 hour  Intake --  Output 600 ml  Net -600 ml   Weight change:  Exam:  General:  Pt is alert, follows commands appropriately, not in acute distress HEENT: No icterus, No thrush, No neck mass, [place]/AT Cardiovascular: RRR, S1/S2, no rubs, no gallops Respiratory: CTA bilaterally, no wheezing, no crackles, no rhonchi Abdomen: Soft/+BS, non tender, non distended, no guarding Extremities: No edema, No lymphangitis, No petechiae, No rashes, no synovitis   Data Reviewed: I have personally reviewed following labs and imaging studies Basic Metabolic Panel: Recent Labs  Lab 01/15/22 1916 01/16/22 0423  NA 139 136  K 4.5 3.4*  CL 106 106  CO2 26 22  GLUCOSE 120* 102*  BUN 11 11  CREATININE 0.56 0.57  CALCIUM 9.3 8.6*   Liver Function Tests: No results for input(s): "AST", "ALT", "ALKPHOS", "BILITOT", "PROT", "ALBUMIN" in the last 168 hours. No results for input(s): "LIPASE", "AMYLASE" in the last 168 hours. No results for input(s): "AMMONIA" in the last 168 hours. Coagulation Profile: Recent Labs  Lab 01/15/22 1916  INR 1.0   CBC: Recent Labs  Lab 01/15/22 1916  01/16/22 0423  WBC 11.1* 9.8  NEUTROABS 9.5*  --   HGB 12.5 11.6*  HCT 38.5 36.4  MCV 98.2 97.8  PLT 188 183   Cardiac Enzymes: No results for input(s): "CKTOTAL", "CKMB", "CKMBINDEX", "TROPONINI" in the last 168 hours. BNP: Invalid input(s): "POCBNP" CBG: No results for input(s): "GLUCAP" in the last 168 hours. HbA1C: No results for input(s): "HGBA1C" in the last 72 hours. Urine analysis:    Component Value Date/Time   COLORURINE COLORLESS (A) 01/15/2022 2001   APPEARANCEUR CLEAR 01/15/2022 2001   LABSPEC 1.004 (L) 01/15/2022 2001   PHURINE 7.0 01/15/2022 2001   GLUCOSEU NEGATIVE 01/15/2022 2001   HGBUR SMALL (A) 01/15/2022 2001   BILIRUBINUR NEGATIVE 01/15/2022 2001   KETONESUR 5 (A) 01/15/2022 2001   PROTEINUR NEGATIVE 01/15/2022 2001   NITRITE NEGATIVE 01/15/2022 2001   LEUKOCYTESUR NEGATIVE 01/15/2022 2001   Sepsis Labs: @LABRCNTIP (procalcitonin:4,lacticidven:4) )No results found for this or any previous visit (from the past 240 hour(s)).   Scheduled Meds:  aspirin EC  81 mg Oral Daily   carvedilol  6.25 mg Oral BID   omega-3 acid ethyl esters  1 g Oral Daily   PHENObarbital  30 mg Oral TID   rosuvastatin  20 mg Oral Daily   Continuous Infusions:  Procedures/Studies: CT HIP LEFT WO CONTRAST  Result Date: 01/15/2022 CLINICAL DATA:  Left hip fracture EXAM: CT OF THE LEFT HIP WITHOUT CONTRAST TECHNIQUE: Multidetector CT imaging of the left hip was performed according to the standard protocol. Multiplanar CT image reconstructions were also generated. RADIATION DOSE REDUCTION: This exam was performed according to the departmental dose-optimization program which includes automated exposure control, adjustment of the mA and/or kV according to patient size and/or use of iterative reconstruction technique. COMPARISON:  Same-day x-ray FINDINGS: Bones/Joint/Cartilage Acute transcervical left femoral neck fracture with mild impaction. No significant displacement or angulation.  Fracture extends to the lateral most margin of the femoral head which may involve the articular surface (series 6, image 59). No fracture involvement of the intertrochanteric region. Hip joint alignment is maintained without dislocation. Joint space is relatively preserved with mild marginal osteophyte formation. Moderate-sized hip joint effusion. Included portion of the left hemipelvis is intact. No additional fractures are seen. No pelvic diastasis. Ligaments Suboptimally assessed by CT. Muscles and Tendons No acute musculotendinous abnormality by CT. Soft tissues No hematoma within the soft tissues. No left inguinal lymphadenopathy. Diverticular changes of the sigmoid colon. No acute findings within the left hemipelvis. IMPRESSION: 1. Acute transcervical left femoral neck fracture with mild impaction. Fracture extends to the lateral most margin of the femoral head which may involve the articular surface. 2. Moderate-sized hip joint effusion. Electronically Signed   By: 03/18/2022 D.O.   On: 01/15/2022 21:36   DG Hip Unilat W or Wo Pelvis 2-3 Views Left  Result Date: 01/15/2022 CLINICAL DATA:  Left hip pain.  Fall EXAM: DG HIP (WITH OR WITHOUT PELVIS) 2-3V LEFT COMPARISON:  None Available. FINDINGS: Acute minimally impacted left femoral neck fracture. No significant displacement or angulation. Bones are demineralized. No dislocation.  IMPRESSION: Acute minimally impacted left femoral neck fracture. Electronically Signed   By: Duanne Guess D.O.   On: 01/15/2022 18:45   DG Knee Complete 4 Views Left  Result Date: 01/15/2022 CLINICAL DATA:  left hip pain EXAM: LEFT KNEE - COMPLETE 4+ VIEW COMPARISON:  None Available. FINDINGS: Decreased bone density. No evidence of fracture, dislocation, or joint effusion. No evidence of arthropathy or other focal bone abnormality. Soft tissues are unremarkable. IMPRESSION: No acute displaced fracture or dislocation. Electronically Signed   By: Tish Frederickson M.D.    On: 01/15/2022 18:45    Catarina Hartshorn, DO  [facility]  If 7PM-7AM, please contact night-coverage [email or web address] Password [email or web address] 01/16/2022, 7:15 AM   LOS: 1 day

## 2022-01-16 NOTE — TOC Progression Note (Signed)
  Transition of Care [facility]) Screening Note   Patient Details  Name: Destiny Hoffman Date of Birth: 15-Mar-1946   [facility]) CM/SW Contact:    Elliot Gault, LCSW Phone Number: 01/16/2022, 11:37 AM    Per MD, pt transferring to [facility] for her hip surgery. TOC at [facility] will follow up after surgery/PT evaluation to assist with dc planning.  Transition of Care Department [facility]) has reviewed patient and no TOC needs have been identified at this time. We will continue to monitor patient advancement through interdisciplinary progression rounds. If new patient transition needs arise, please place a TOC consult.

## 2022-01-16 NOTE — Assessment & Plan Note (Signed)
Judicious opioids Dr. Charlann Boxer consulted>>transfer to [facility] for operative repair PT/OT after operative repair

## 2022-01-16 NOTE — ED Notes (Signed)
Provider at bedside

## 2022-01-16 NOTE — Assessment & Plan Note (Signed)
Appears clinically euvolemic/compensated 8222 echo EF 30-35%, grade 1 DD, moderate TR Continue carvedilol

## 2022-01-16 NOTE — Hospital Course (Signed)
75-yof w/ CAD-MI s/p BMS LAD 2011 w/ small RCA 80% (med rx), HFrEF, hypertension, hyperlipidemia, seizure disorder presenting after mechanical fall-patient tripped over her cot at home landing on her left side/in ED- CT of the left hip showed left femoral neck fracture with mild impaction.  Orthopedics, Dr. Cyndie Chime was consulted and requested transfer to [facility].labd done- EKG showed normal sinus rhythm.  There is no ST-T wave changes. Transferred to [facility] per Dr. Charlann Boxer.

## 2022-01-16 NOTE — ED Notes (Signed)
Pt's O2 sat at 89%. Placed pt on 2L [place]. Pt's O2 sat now at 94% Pt resting comfortably and in no distress at this time

## 2022-01-16 NOTE — ED Notes (Signed)
Called pharmacy regarding pt medication phenobarbital

## 2022-01-16 NOTE — Assessment & Plan Note (Signed)
Continue carvedilol 

## 2022-01-16 NOTE — Assessment & Plan Note (Signed)
-   Continue phenobarbital 

## 2022-01-16 NOTE — ED Notes (Signed)
Report given to [facility], ETA of 30 mins  

## 2022-01-17 ENCOUNTER — Encounter (HOSPITAL_COMMUNITY): Payer: Self-pay | Admitting: Internal Medicine

## 2022-01-17 DIAGNOSIS — I255 Ischemic cardiomyopathy: Secondary | ICD-10-CM | POA: Diagnosis not present

## 2022-01-17 DIAGNOSIS — S72002A Fracture of unspecified part of neck of left femur, initial encounter for closed fracture: Secondary | ICD-10-CM | POA: Diagnosis not present

## 2022-01-17 DIAGNOSIS — Z01810 Encounter for preprocedural cardiovascular examination: Secondary | ICD-10-CM | POA: Diagnosis not present

## 2022-01-17 DIAGNOSIS — I2510 Atherosclerotic heart disease of native coronary artery without angina pectoris: Secondary | ICD-10-CM | POA: Diagnosis not present

## 2022-01-17 LAB — SURGICAL PCR SCREEN
MRSA, PCR: NEGATIVE
Staphylococcus aureus: POSITIVE — AB

## 2022-01-17 MED ORDER — ENSURE ENLIVE PO LIQD
237.0000 mL | Freq: Two times a day (BID) | ORAL | Status: DC
  Administered 2022-01-17 – 2022-01-20 (×5): 237 mL via ORAL

## 2022-01-17 MED ORDER — ADULT MULTIVITAMIN W/MINERALS CH
1.0000 | ORAL_TABLET | Freq: Every day | ORAL | Status: DC
  Administered 2022-01-17 – 2022-01-20 (×3): 1 via ORAL
  Filled 2022-01-17 (×3): qty 1

## 2022-01-17 MED ORDER — MUPIROCIN 2 % EX OINT
1.0000 | TOPICAL_OINTMENT | Freq: Two times a day (BID) | CUTANEOUS | Status: DC
  Administered 2022-01-17 – 2022-01-20 (×6): 1 via NASAL
  Filled 2022-01-17 (×2): qty 22

## 2022-01-17 MED ORDER — CHLORHEXIDINE GLUCONATE CLOTH 2 % EX PADS
6.0000 | MEDICATED_PAD | Freq: Every day | CUTANEOUS | Status: DC
  Administered 2022-01-18: 6 via TOPICAL

## 2022-01-17 NOTE — Consult Note (Signed)
Cardiology Consultation:   Patient ID: Destiny Hoffman MRN: [id number]; DOB: 24-Oct-1945  Admit date: 01/15/2022 Date of Consult: 01/17/2022  PCP:  Pearson Grippe, MD   [facility] Providers Cardiologist:  Nona Dell, MD        Patient Profile:   Destiny Hoffman is a 76 y.o. female with a hx of coronary artery disease, ischemic cardiomyopathy EF 30% who is being seen 01/17/2022 for the evaluation of left hip fracture at the request of Dr. Dayna Barker.  History of Present Illness:   Destiny Hoffman is a 76 year old female with prior myocardial infarction anterior wall with bare-metal stent to the LAD in 2011 (small 80% RCA lesion with medical management), ischemic cardiomyopathy EF 30 to 35% with hypertension hyperlipidemia seizure disorder prior hyperkalemia on ACE inhibitor who unfortunately fell and broke her left hip (left femoral neck fracture with mild impaction). No syncope.   She has not had any chest discomfort/anginal symptoms.  No significant heart failure symptoms.  No orthopnea no PND no edema.  Minimal shortness of breath with activity which she has attributed to deconditioning in the past.  She is not on any diuretics.  She is on carvedilol, Crestor.  Her son unfortunately died in 07-21-21.  Her husband had been under hospice care with COPD.   Past Medical History:  Diagnosis Date   Coronary atherosclerosis of native coronary artery    BMS to LAD 2011, residual RCA disease managed medically   HTN (hypertension)    Hyperkalemia on ACE inhibitors    Hyperlipidemia LDL goal <70    Ischemic cardiomyopathy    LVEF 25-30% echo 2014   Myocardial infarction, anterior wall (HCC) 07/17/2009   Seizure disorder (HCC)     Past Surgical History:  Procedure Laterality Date   CORONARY ANGIOPLASTY WITH STENT PLACEMENT  2011   BMS LAD     Home Medications:  Prior to Admission medications   Medication Sig Start Date End Date Taking? Authorizing Provider  aspirin 81 MG tablet  Take 81 mg by mouth daily.   Yes [provider]  carvedilol (COREG) 6.25 MG tablet Take 6.25 mg by mouth 2 (two) times daily. 06/04/19  Yes [provider]  Omega-3 Fatty Acids (FISH OIL) 1000 MG CAPS Take 1 capsule by mouth daily.   Yes [provider]  PHENObarbital (LUMINAL) 30 MG tablet Take 30 mg by mouth 3 (three) times daily.   Yes [provider]  rosuvastatin (CRESTOR) 20 MG tablet Take 1 tablet (20 mg total) by mouth daily. 01/25/15  Yes Jonelle Sidle, MD  nitroGLYCERIN (NITROSTAT) 0.4 MG SL tablet Place 1 tablet (0.4 mg total) under the tongue every 5 (five) minutes x 3 doses as needed (If no relief after 3rd dose, proceed to the ED for an evalution). 01/11/18   Jonelle Sidle, MD    Inpatient Medications: Scheduled Meds:  aspirin EC  81 mg Oral Daily   carvedilol  6.25 mg Oral BID   feeding supplement  237 mL Oral BID BM   multivitamin with minerals  1 tablet Oral Daily   omega-3 acid ethyl esters  1 g Oral Daily   PHENObarbital  30 mg Oral TID   rosuvastatin  20 mg Oral Daily   Continuous Infusions:  PRN Meds: HYDROcodone-acetaminophen, morphine injection, nitroGLYCERIN  Allergies:    Allergies  Allergen Reactions   Ace Inhibitors     Social History:   Social History   Socioeconomic History   Marital status: Married  Spouse name: Not on file   Number of children: Not on file   Years of education: Not on file   Highest education level: Not on file  Occupational History   Not on file  Tobacco Use   Smoking status: Former    Types: Cigarettes    Quit date: 07/17/2010    Years since quitting: 11.5   Smokeless tobacco: Never  Vaping Use   Vaping Use: Never used  Substance and Sexual Activity   Alcohol use: No    Alcohol/week: 0.0 standard drinks of alcohol   Drug use: No   Sexual activity: Not on file  Other Topics Concern   Not on file  Social History Narrative   Not on file   Social Determinants of Health    Financial Resource Strain: Not on file  Food Insecurity: Not on file  Transportation Needs: Not on file  Physical Activity: Not on file  Stress: Not on file  Social Connections: Not on file  Intimate Partner Violence: Not on file    Family History:    Family History  Problem Relation Age of Onset   Heart attack Father        Died age 43     ROS:  Please see the history of present illness.   All other ROS reviewed and negative.     Physical Exam/Data:   Vitals:   01/17/22 0003 01/17/22 0130 01/17/22 0633 01/17/22 1000  BP:  112/65 (!) 155/80 (!) 156/74  Pulse:  86 96 95  Resp:  17 17 16   Temp:  98.8 F (37.1 C) 99 F (37.2 C) 99.5 F (37.5 C)  TempSrc:  Oral Oral Oral  SpO2:  94% 95% 97%  Weight: 61.7 kg     Height: 5\' 2"  (1.575 m)       Intake/Output Summary (Last 24 hours) at 01/17/2022 1118 Last data filed at 01/17/2022 1000 Gross per 24 hour  Intake 240 ml  Output 200 ml  Net 40 ml      01/17/2022   12:03 AM 01/15/2022    4:04 PM 09/16/2021    2:32 PM  Last 3 Weights  Weight (lbs) 136 lb 132 lb 136 lb 3.2 oz  Weight (kg) 61.689 kg 59.875 kg 61.78 kg     Body mass index is 24.87 kg/m.  General:  Well nourished, well developed, in no acute distress HEENT: normal Neck: no JVD Vascular: No carotid bruits; Distal pulses 2+ bilaterally Cardiac:  normal S1, S2; RRR; no murmur  Lungs:  clear to auscultation bilaterally, no wheezing, rhonchi or rales  Abd: soft, nontender, no hepatomegaly  Ext: no edema Musculoskeletal:  No deformities, BUE and BLE strength normal and equal Skin: warm and dry  Neuro:  CNs 2-12 intact, no focal abnormalities noted Psych:  Normal affect   EKG:  The EKG was personally reviewed and demonstrates: Sinus rhythm 94 no ischemic changes   Relevant CV Studies:  ECHO 02/2021   1. Left ventricular ejection fraction, by estimation, is 30 to 35%. The  left ventricle has moderately decreased function. The left ventricle  demonstrates  regional wall motion abnormalities (see scoring  diagram/findings for description). Left ventricular   diastolic parameters are consistent with Grade I diastolic dysfunction  (impaired relaxation). Calcified trabeculation near apex without obvious  thrombus.   2. Right ventricular systolic function is normal. The right ventricular  size is normal. There is normal pulmonary artery systolic pressure. The  estimated right ventricular systolic pressure is  28.6 mmHg.   3. Left atrial size was mildly dilated.   4. The mitral valve is abnormal. Mild mitral valve regurgitation. The  mean mitral valve gradient is 2.0 mmHg.   5. Tricuspid valve regurgitation is moderate.   6. The aortic valve is tricuspid. Aortic valve regurgitation is not  visualized. Aortic valve mean gradient measures 3.0 mmHg.   7. The inferior vena cava is normal in size with greater than 50%  respiratory variability, suggesting right atrial pressure of 3 mmHg.   Laboratory Data:  High Sensitivity Troponin:  No results for input(s): "TROPONINIHS" in the last 720 hours.   Chemistry Recent Labs  Lab 01/15/22 1916 01/16/22 0423  NA 139 136  K 4.5 3.4*  CL 106 106  CO2 26 22  GLUCOSE 120* 102*  BUN 11 11  CREATININE 0.56 0.57  CALCIUM 9.3 8.6*  GFRNONAA >60 >60  ANIONGAP 7 8    No results for input(s): "PROT", "ALBUMIN", "AST", "ALT", "ALKPHOS", "BILITOT" in the last 168 hours. Lipids No results for input(s): "CHOL", "TRIG", "HDL", "LABVLDL", "LDLCALC", "CHOLHDL" in the last 168 hours.  Hematology Recent Labs  Lab 01/15/22 1916 01/16/22 0423  WBC 11.1* 9.8  RBC 3.92 3.72*  HGB 12.5 11.6*  HCT 38.5 36.4  MCV 98.2 97.8  MCH 31.9 31.2  MCHC 32.5 31.9  RDW 12.5 12.7  PLT 188 183   Thyroid No results for input(s): "TSH", "FREET4" in the last 168 hours.  BNPNo results for input(s): "BNP", "PROBNP" in the last 168 hours.  DDimer No results for input(s): "DDIMER" in the last 168 hours.   Radiology/Studies:   CT HIP LEFT WO CONTRAST  Result Date: 01/15/2022 CLINICAL DATA:  Left hip fracture EXAM: CT OF THE LEFT HIP WITHOUT CONTRAST TECHNIQUE: Multidetector CT imaging of the left hip was performed according to the standard protocol. Multiplanar CT image reconstructions were also generated. RADIATION DOSE REDUCTION: This exam was performed according to the departmental dose-optimization program which includes automated exposure control, adjustment of the mA and/or kV according to patient size and/or use of iterative reconstruction technique. COMPARISON:  Same-day x-ray FINDINGS: Bones/Joint/Cartilage Acute transcervical left femoral neck fracture with mild impaction. No significant displacement or angulation. Fracture extends to the lateral most margin of the femoral head which may involve the articular surface (series 6, image 59). No fracture involvement of the intertrochanteric region. Hip joint alignment is maintained without dislocation. Joint space is relatively preserved with mild marginal osteophyte formation. Moderate-sized hip joint effusion. Included portion of the left hemipelvis is intact. No additional fractures are seen. No pelvic diastasis. Ligaments Suboptimally assessed by CT. Muscles and Tendons No acute musculotendinous abnormality by CT. Soft tissues No hematoma within the soft tissues. No left inguinal lymphadenopathy. Diverticular changes of the sigmoid colon. No acute findings within the left hemipelvis. IMPRESSION: 1. Acute transcervical left femoral neck fracture with mild impaction. Fracture extends to the lateral most margin of the femoral head which may involve the articular surface. 2. Moderate-sized hip joint effusion. Electronically Signed   By: Duanne Guess D.O.   On: 01/15/2022 21:36   DG Hip Unilat W or Wo Pelvis 2-3 Views Left  Result Date: 01/15/2022 CLINICAL DATA:  Left hip pain.  Fall EXAM: DG HIP (WITH OR WITHOUT PELVIS) 2-3V LEFT COMPARISON:  None Available. FINDINGS:  Acute minimally impacted left femoral neck fracture. No significant displacement or angulation. Bones are demineralized. No dislocation. IMPRESSION: Acute minimally impacted left femoral neck fracture. Electronically Signed   By: Janyth Pupa  Plundo D.O.   On: 01/15/2022 18:45   DG Knee Complete 4 Views Left  Result Date: 01/15/2022 CLINICAL DATA:  left hip pain EXAM: LEFT KNEE - COMPLETE 4+ VIEW COMPARISON:  None Available. FINDINGS: Decreased bone density. No evidence of fracture, dislocation, or joint effusion. No evidence of arthropathy or other focal bone abnormality. Soft tissues are unremarkable. IMPRESSION: No acute displaced fracture or dislocation. Electronically Signed   By: Tish Frederickson M.D.   On: 01/15/2022 18:45     Assessment and Plan:   76 year old with known coronary disease status post bare-metal stent to LAD in 2011 in the setting of myocardial infarction with ischemic cardiomyopathy EF 30 to 35% status post fall with left hip fracture (femoral neck).  Preop cardiac risk stratification - She is at moderate risk for left total hip arthroplasty and may proceed. -From a cardiac standpoint she is stable.  No evidence of active heart failure.  Coronary artery disease - Prior bare-metal stent to LAD in the setting of myocardial infarction in 2011.  No residual anginal symptoms.  Continuing with medication management, Crestor, aspirin, carvedilol.  Ischemic cardiomyopathy, chronic systolic heart failure - Ejection fraction in the 35% range however she is NYHA class I-II.  Minimal limitations with exertion.  She appears euvolemic.  No orthopnea no PND.  She is not on any maintenance diuretic.  Continue with carvedilol.  She is not on ARN I secondary to prior hyperkalemia.  Risk Assessment/Risk Scores:       New York Heart Association (NYHA) Functional Class NYHA Class I-II     For questions or updates, please contact [facility] Please consult [email or web address] for contact  info under    Signed, Donato Schultz, MD  01/17/2022 11:18 AM

## 2022-01-17 NOTE — H&P (View-Only) (Signed)
Reason for Consult:Left femoral neck fracture Referring Physician: Dr Jonathon Bellows  Destiny Hoffman is an 76 y.o. female.  HPI: 76 yo female who was getting ready to go to church on 7/2 and tripped in her room landing on her left side with immediate left hip pain and difficulty trying to get up. She did not have any prodromal symptoms leading to the fall. This was a mechanical fall. She denies any headache or LOC associated with the fall/ Her only complaint has been left hip pain. She was initially seen at [facility] ED and noted to have a left femoral neck fracture. She was transferred here last night for hospitalist admission and fracture management by Ortho team. Her only complaint is left hip pain. She denies any lower extremity paresthesia or radiating pain  Past Medical History:  Diagnosis Date   Coronary atherosclerosis of native coronary artery    BMS to LAD 2011, residual RCA disease managed medically   HTN (hypertension)    Hyperkalemia on ACE inhibitors    Hyperlipidemia LDL goal <70    Ischemic cardiomyopathy    LVEF 25-30% echo 2014   Myocardial infarction, anterior wall (HCC) 07/17/2009   Seizure disorder (HCC)     Past Surgical History:  Procedure Laterality Date   CORONARY ANGIOPLASTY WITH STENT PLACEMENT  2011   BMS LAD    Family History  Problem Relation Age of Onset   Heart attack Father        Died age 31    Social History:  reports that she quit smoking about 11 years ago. Her smoking use included cigarettes. She has never used smokeless tobacco. She reports that she does not drink alcohol and does not use drugs.  Allergies:  Allergies  Allergen Reactions   Ace Inhibitors     Medications: I have reviewed the patient's current medications.  Results for orders placed or performed during the hospital encounter of 01/15/22 (from the past 48 hour(s))  Basic metabolic panel     Status: Abnormal   Collection Time: 01/15/22  7:16 PM  Result Value Ref Range   Sodium  139 135 - 145 mmol/L   Potassium 4.5 3.5 - 5.1 mmol/L   Chloride 106 98 - 111 mmol/L   CO2 26 22 - 32 mmol/L   Glucose, Bld 120 (H) 70 - 99 mg/dL    Comment: Glucose reference range applies only to samples taken after fasting for at least 8 hours.   BUN 11 8 - 23 mg/dL   Creatinine, Ser 3.30 0.44 - 1.00 mg/dL   Calcium 9.3 8.9 - 07.6 mg/dL   GFR, Estimated >22 >63 mL/min    Comment: (NOTE) Calculated using the CKD-EPI Creatinine Equation (2021)    Anion gap 7 5 - 15    Comment: Performed at [facility], [address], [place], [place] [address]  CBC with Differential     Status: Abnormal   Collection Time: 01/15/22  7:16 PM  Result Value Ref Range   WBC 11.1 (H) 4.0 - 10.5 K/uL   RBC 3.92 3.87 - 5.11 MIL/uL   Hemoglobin 12.5 12.0 - 15.0 g/dL   HCT 62.5 63.8 - 93.7 %   MCV 98.2 80.0 - 100.0 fL   MCH 31.9 26.0 - 34.0 pg   MCHC 32.5 30.0 - 36.0 g/dL   RDW 34.2 87.6 - 81.1 %   Platelets 188 150 - 400 K/uL   nRBC 0.0 0.0 - 0.2 %   Neutrophils Relative % 85 %  Neutro Abs 9.5 (H) 1.7 - 7.7 K/uL   Lymphocytes Relative 8 %   Lymphs Abs 0.9 0.7 - 4.0 K/uL   Monocytes Relative 6 %   Monocytes Absolute 0.6 0.1 - 1.0 K/uL   Eosinophils Relative 1 %   Eosinophils Absolute 0.1 0.0 - 0.5 K/uL   Basophils Relative 0 %   Basophils Absolute 0.0 0.0 - 0.1 K/uL   Immature Granulocytes 0 %   Abs Immature Granulocytes 0.04 0.00 - 0.07 K/uL    Comment: Performed at [facility], [address] [place], [place] [address]  Protime-INR     Status: None   Collection Time: 01/15/22  7:16 PM  Result Value Ref Range   Prothrombin Time 12.8 11.4 - 15.2 seconds   INR 1.0 0.8 - 1.2    Comment: (NOTE) INR goal varies based on device and disease states. Performed at [facility], [address] [place], [place] [address]   Type and screen [facility]     Status: None   Collection Time: 01/15/22  7:16 PM  Result Value Ref Range   ABO/RH(D) O POS    Antibody Screen NEG    Sample Expiration       01/18/2022,2359 Performed at [facility], [address], [place], [place] [address]   Urinalysis, Routine w reflex microscopic     Status: Abnormal   Collection Time: 01/15/22  8:01 PM  Result Value Ref Range   Color, Urine COLORLESS (A) YELLOW   APPearance CLEAR CLEAR   Specific Gravity, Urine 1.004 (L) 1.005 - 1.030   pH 7.0 5.0 - 8.0   Glucose, UA NEGATIVE NEGATIVE mg/dL   Hgb urine dipstick SMALL (A) NEGATIVE   Bilirubin Urine NEGATIVE NEGATIVE   Ketones, ur 5 (A) NEGATIVE mg/dL   Protein, ur NEGATIVE NEGATIVE mg/dL   Nitrite NEGATIVE NEGATIVE   Leukocytes,Ua NEGATIVE NEGATIVE   RBC / HPF 0-5 0 - 5 RBC/hpf   Bacteria, UA NONE SEEN NONE SEEN   Mucus PRESENT     Comment: Performed at [facility], [address], [place], [place] [address]  CBC     Status: Abnormal   Collection Time: 01/16/22  4:23 AM  Result Value Ref Range   WBC 9.8 4.0 - 10.5 K/uL   RBC 3.72 (L) 3.87 - 5.11 MIL/uL   Hemoglobin 11.6 (L) 12.0 - 15.0 g/dL   HCT 15.7 26.2 - 03.5 %   MCV 97.8 80.0 - 100.0 fL   MCH 31.2 26.0 - 34.0 pg   MCHC 31.9 30.0 - 36.0 g/dL   RDW 59.7 41.6 - 38.4 %   Platelets 183 150 - 400 K/uL   nRBC 0.0 0.0 - 0.2 %    Comment: Performed at [facility], [address], [place], [place] [address]  Basic metabolic panel     Status: Abnormal   Collection Time: 01/16/22  4:23 AM  Result Value Ref Range   Sodium 136 135 - 145 mmol/L   Potassium 3.4 (L) 3.5 - 5.1 mmol/L    Comment: DELTA CHECK NOTED   Chloride 106 98 - 111 mmol/L   CO2 22 22 - 32 mmol/L   Glucose, Bld 102 (H) 70 - 99 mg/dL    Comment: Glucose reference range applies only to samples taken after fasting for at least 8 hours.   BUN 11 8 - 23 mg/dL   Creatinine, Ser 8.03 0.44 - 1.00 mg/dL   Calcium 8.6 (L) 8.9 - 10.3 mg/dL   GFR,  Estimated >60 >60 mL/min    Comment: (NOTE) Calculated using the CKD-EPI Creatinine Equation (2021)    Anion gap 8 5 - 15    Comment: Performed at [facility], [address]  [place], [place] [address]    CT HIP LEFT WO CONTRAST  Result Date: 01/15/2022 CLINICAL DATA:  Left hip fracture EXAM: CT OF THE LEFT HIP WITHOUT CONTRAST TECHNIQUE: Multidetector CT imaging of the left hip was performed according to the standard protocol. Multiplanar CT image reconstructions were also generated. RADIATION DOSE REDUCTION: This exam was performed according to the departmental dose-optimization program which includes automated exposure control, adjustment of the mA and/or kV according to patient size and/or use of iterative reconstruction technique. COMPARISON:  Same-day x-ray FINDINGS: Bones/Joint/Cartilage Acute transcervical left femoral neck fracture with mild impaction. No significant displacement or angulation. Fracture extends to the lateral most margin of the femoral head which may involve the articular surface (series 6, image 59). No fracture involvement of the intertrochanteric region. Hip joint alignment is maintained without dislocation. Joint space is relatively preserved with mild marginal osteophyte formation. Moderate-sized hip joint effusion. Included portion of the left hemipelvis is intact. No additional fractures are seen. No pelvic diastasis. Ligaments Suboptimally assessed by CT. Muscles and Tendons No acute musculotendinous abnormality by CT. Soft tissues No hematoma within the soft tissues. No left inguinal lymphadenopathy. Diverticular changes of the sigmoid colon. No acute findings within the left hemipelvis. IMPRESSION: 1. Acute transcervical left femoral neck fracture with mild impaction. Fracture extends to the lateral most margin of the femoral head which may involve the articular surface. 2. Moderate-sized hip joint effusion. Electronically Signed   By: Duanne Guess D.O.   On: 01/15/2022 21:36   DG Hip Unilat W or Wo Pelvis 2-3 Views Left  Result Date: 01/15/2022 CLINICAL DATA:  Left hip pain.  Fall EXAM: DG HIP (WITH OR WITHOUT PELVIS) 2-3V LEFT COMPARISON:  None  Available. FINDINGS: Acute minimally impacted left femoral neck fracture. No significant displacement or angulation. Bones are demineralized. No dislocation. IMPRESSION: Acute minimally impacted left femoral neck fracture. Electronically Signed   By: Duanne Guess D.O.   On: 01/15/2022 18:45   DG Knee Complete 4 Views Left  Result Date: 01/15/2022 CLINICAL DATA:  left hip pain EXAM: LEFT KNEE - COMPLETE 4+ VIEW COMPARISON:  None Available. FINDINGS: Decreased bone density. No evidence of fracture, dislocation, or joint effusion. No evidence of arthropathy or other focal bone abnormality. Soft tissues are unremarkable. IMPRESSION: No acute displaced fracture or dislocation. Electronically Signed   By: Tish Frederickson M.D.   On: 01/15/2022 18:45    Review of Systems Blood pressure (!) 155/80, pulse 96, temperature 99 F (37.2 C), temperature source Oral, resp. rate 17, height 5\' 2"  (1.575 m), weight 61.7 kg, SpO2 95 %. Physical Exam  Physical Examination: General appearance - alert, well appearing, and in no distress Mental status - alert, oriented to person, place, and time Chest - clear to auscultation, no wheezes, rales or rhonchi, symmetric air entry Heart - normal rate, regular rhythm, normal S1, S2, no murmurs, rubs, clicks or gallops Abdomen - soft, nontender, nondistended, no masses or organomegaly Neurological - alert, oriented, normal speech, no focal findings or movement disorder noted Left lower extremity without shortening or  rotational deformity; EHL, FHL, TA, Gastroc intact. Sensation and pulses intact  X-ray- Left femoral neck fracture with valgus impaction but partially displaced with mild to moderate baseline osteoarthritis  Assessment/Plan: Left femoral neck fracture- The patient  has a partially displaced fracture with mild to moderate underlying osteoarthritis. She will require operative fixation in the form of a left total hip arthroplasty. Apparently Dr Charlann Boxer has contacted  Dr Linna Caprice who plans on doing the surgery tomorrow. Pt will be made NPO after midnight  Ollen Gross 01/17/2022, 9:27 AM

## 2022-01-17 NOTE — Plan of Care (Signed)
  Problem: Clinical Measurements: Goal: Ability to maintain clinical measurements within normal limits will improve Outcome: Progressing   

## 2022-01-17 NOTE — Consult Note (Signed)
Reason for Consult:Left femoral neck fracture Referring Physician: Dr Jonathon Bellows  Destiny Hoffman is an 76 y.o. female.  HPI: 76 yo female who was getting ready to go to church on 7/2 and tripped in her room landing on her left side with immediate left hip pain and difficulty trying to get up. She did not have any prodromal symptoms leading to the fall. This was a mechanical fall. She denies any headache or LOC associated with the fall/ Her only complaint has been left hip pain. She was initially seen at [facility] ED and noted to have a left femoral neck fracture. She was transferred here last night for hospitalist admission and fracture management by Ortho team. Her only complaint is left hip pain. She denies any lower extremity paresthesia or radiating pain  Past Medical History:  Diagnosis Date   Coronary atherosclerosis of native coronary artery    BMS to LAD 2011, residual RCA disease managed medically   HTN (hypertension)    Hyperkalemia on ACE inhibitors    Hyperlipidemia LDL goal <70    Ischemic cardiomyopathy    LVEF 25-30% echo 2014   Myocardial infarction, anterior wall (HCC) 07/17/2009   Seizure disorder (HCC)     Past Surgical History:  Procedure Laterality Date   CORONARY ANGIOPLASTY WITH STENT PLACEMENT  2011   BMS LAD    Family History  Problem Relation Age of Onset   Heart attack Father        Died age 31    Social History:  reports that she quit smoking about 11 years ago. Her smoking use included cigarettes. She has never used smokeless tobacco. She reports that she does not drink alcohol and does not use drugs.  Allergies:  Allergies  Allergen Reactions   Ace Inhibitors     Medications: I have reviewed the patient's current medications.  Results for orders placed or performed during the hospital encounter of 01/15/22 (from the past 48 hour(s))  Basic metabolic panel     Status: Abnormal   Collection Time: 01/15/22  7:16 PM  Result Value Ref Range   Sodium  139 135 - 145 mmol/L   Potassium 4.5 3.5 - 5.1 mmol/L   Chloride 106 98 - 111 mmol/L   CO2 26 22 - 32 mmol/L   Glucose, Bld 120 (H) 70 - 99 mg/dL    Comment: Glucose reference range applies only to samples taken after fasting for at least 8 hours.   BUN 11 8 - 23 mg/dL   Creatinine, Ser 3.30 0.44 - 1.00 mg/dL   Calcium 9.3 8.9 - 07.6 mg/dL   GFR, Estimated >22 >63 mL/min    Comment: (NOTE) Calculated using the CKD-EPI Creatinine Equation (2021)    Anion gap 7 5 - 15    Comment: Performed at [facility], [address], [place], [place] [address]  CBC with Differential     Status: Abnormal   Collection Time: 01/15/22  7:16 PM  Result Value Ref Range   WBC 11.1 (H) 4.0 - 10.5 K/uL   RBC 3.92 3.87 - 5.11 MIL/uL   Hemoglobin 12.5 12.0 - 15.0 g/dL   HCT 62.5 63.8 - 93.7 %   MCV 98.2 80.0 - 100.0 fL   MCH 31.9 26.0 - 34.0 pg   MCHC 32.5 30.0 - 36.0 g/dL   RDW 34.2 87.6 - 81.1 %   Platelets 188 150 - 400 K/uL   nRBC 0.0 0.0 - 0.2 %   Neutrophils Relative % 85 %  Neutro Abs 9.5 (H) 1.7 - 7.7 K/uL   Lymphocytes Relative 8 %   Lymphs Abs 0.9 0.7 - 4.0 K/uL   Monocytes Relative 6 %   Monocytes Absolute 0.6 0.1 - 1.0 K/uL   Eosinophils Relative 1 %   Eosinophils Absolute 0.1 0.0 - 0.5 K/uL   Basophils Relative 0 %   Basophils Absolute 0.0 0.0 - 0.1 K/uL   Immature Granulocytes 0 %   Abs Immature Granulocytes 0.04 0.00 - 0.07 K/uL    Comment: Performed at [facility], [address] [place], [place] [address]  Protime-INR     Status: None   Collection Time: 01/15/22  7:16 PM  Result Value Ref Range   Prothrombin Time 12.8 11.4 - 15.2 seconds   INR 1.0 0.8 - 1.2    Comment: (NOTE) INR goal varies based on device and disease states. Performed at [facility], [address] [place], [place] [address]   Type and screen [facility]     Status: None   Collection Time: 01/15/22  7:16 PM  Result Value Ref Range   ABO/RH(D) O POS    Antibody Screen NEG    Sample Expiration       01/18/2022,2359 Performed at [facility], [address], [place], [place] [address]   Urinalysis, Routine w reflex microscopic     Status: Abnormal   Collection Time: 01/15/22  8:01 PM  Result Value Ref Range   Color, Urine COLORLESS (A) YELLOW   APPearance CLEAR CLEAR   Specific Gravity, Urine 1.004 (L) 1.005 - 1.030   pH 7.0 5.0 - 8.0   Glucose, UA NEGATIVE NEGATIVE mg/dL   Hgb urine dipstick SMALL (A) NEGATIVE   Bilirubin Urine NEGATIVE NEGATIVE   Ketones, ur 5 (A) NEGATIVE mg/dL   Protein, ur NEGATIVE NEGATIVE mg/dL   Nitrite NEGATIVE NEGATIVE   Leukocytes,Ua NEGATIVE NEGATIVE   RBC / HPF 0-5 0 - 5 RBC/hpf   Bacteria, UA NONE SEEN NONE SEEN   Mucus PRESENT     Comment: Performed at [facility], [address], [place], [place] [address]  CBC     Status: Abnormal   Collection Time: 01/16/22  4:23 AM  Result Value Ref Range   WBC 9.8 4.0 - 10.5 K/uL   RBC 3.72 (L) 3.87 - 5.11 MIL/uL   Hemoglobin 11.6 (L) 12.0 - 15.0 g/dL   HCT 15.7 26.2 - 03.5 %   MCV 97.8 80.0 - 100.0 fL   MCH 31.2 26.0 - 34.0 pg   MCHC 31.9 30.0 - 36.0 g/dL   RDW 59.7 41.6 - 38.4 %   Platelets 183 150 - 400 K/uL   nRBC 0.0 0.0 - 0.2 %    Comment: Performed at [facility], [address], [place], [place] [address]  Basic metabolic panel     Status: Abnormal   Collection Time: 01/16/22  4:23 AM  Result Value Ref Range   Sodium 136 135 - 145 mmol/L   Potassium 3.4 (L) 3.5 - 5.1 mmol/L    Comment: DELTA CHECK NOTED   Chloride 106 98 - 111 mmol/L   CO2 22 22 - 32 mmol/L   Glucose, Bld 102 (H) 70 - 99 mg/dL    Comment: Glucose reference range applies only to samples taken after fasting for at least 8 hours.   BUN 11 8 - 23 mg/dL   Creatinine, Ser 8.03 0.44 - 1.00 mg/dL   Calcium 8.6 (L) 8.9 - 10.3 mg/dL   GFR,  Estimated >60 >60 mL/min    Comment: (NOTE) Calculated using the CKD-EPI Creatinine Equation (2021)    Anion gap 8 5 - 15    Comment: Performed at [facility], [address]  [place], [place] [address]    CT HIP LEFT WO CONTRAST  Result Date: 01/15/2022 CLINICAL DATA:  Left hip fracture EXAM: CT OF THE LEFT HIP WITHOUT CONTRAST TECHNIQUE: Multidetector CT imaging of the left hip was performed according to the standard protocol. Multiplanar CT image reconstructions were also generated. RADIATION DOSE REDUCTION: This exam was performed according to the departmental dose-optimization program which includes automated exposure control, adjustment of the mA and/or kV according to patient size and/or use of iterative reconstruction technique. COMPARISON:  Same-day x-ray FINDINGS: Bones/Joint/Cartilage Acute transcervical left femoral neck fracture with mild impaction. No significant displacement or angulation. Fracture extends to the lateral most margin of the femoral head which may involve the articular surface (series 6, image 59). No fracture involvement of the intertrochanteric region. Hip joint alignment is maintained without dislocation. Joint space is relatively preserved with mild marginal osteophyte formation. Moderate-sized hip joint effusion. Included portion of the left hemipelvis is intact. No additional fractures are seen. No pelvic diastasis. Ligaments Suboptimally assessed by CT. Muscles and Tendons No acute musculotendinous abnormality by CT. Soft tissues No hematoma within the soft tissues. No left inguinal lymphadenopathy. Diverticular changes of the sigmoid colon. No acute findings within the left hemipelvis. IMPRESSION: 1. Acute transcervical left femoral neck fracture with mild impaction. Fracture extends to the lateral most margin of the femoral head which may involve the articular surface. 2. Moderate-sized hip joint effusion. Electronically Signed   By: Duanne Guess D.O.   On: 01/15/2022 21:36   DG Hip Unilat W or Wo Pelvis 2-3 Views Left  Result Date: 01/15/2022 CLINICAL DATA:  Left hip pain.  Fall EXAM: DG HIP (WITH OR WITHOUT PELVIS) 2-3V LEFT COMPARISON:  None  Available. FINDINGS: Acute minimally impacted left femoral neck fracture. No significant displacement or angulation. Bones are demineralized. No dislocation. IMPRESSION: Acute minimally impacted left femoral neck fracture. Electronically Signed   By: Duanne Guess D.O.   On: 01/15/2022 18:45   DG Knee Complete 4 Views Left  Result Date: 01/15/2022 CLINICAL DATA:  left hip pain EXAM: LEFT KNEE - COMPLETE 4+ VIEW COMPARISON:  None Available. FINDINGS: Decreased bone density. No evidence of fracture, dislocation, or joint effusion. No evidence of arthropathy or other focal bone abnormality. Soft tissues are unremarkable. IMPRESSION: No acute displaced fracture or dislocation. Electronically Signed   By: Tish Frederickson M.D.   On: 01/15/2022 18:45    Review of Systems Blood pressure (!) 155/80, pulse 96, temperature 99 F (37.2 C), temperature source Oral, resp. rate 17, height 5\' 2"  (1.575 m), weight 61.7 kg, SpO2 95 %. Physical Exam  Physical Examination: General appearance - alert, well appearing, and in no distress Mental status - alert, oriented to person, place, and time Chest - clear to auscultation, no wheezes, rales or rhonchi, symmetric air entry Heart - normal rate, regular rhythm, normal S1, S2, no murmurs, rubs, clicks or gallops Abdomen - soft, nontender, nondistended, no masses or organomegaly Neurological - alert, oriented, normal speech, no focal findings or movement disorder noted Left lower extremity without shortening or  rotational deformity; EHL, FHL, TA, Gastroc intact. Sensation and pulses intact  X-ray- Left femoral neck fracture with valgus impaction but partially displaced with mild to moderate baseline osteoarthritis  Assessment/Plan: Left femoral neck fracture- The patient  has a partially displaced fracture with mild to moderate underlying osteoarthritis. She will require operative fixation in the form of a left total hip arthroplasty. Apparently Dr Charlann Boxer has contacted  Dr Linna Caprice who plans on doing the surgery tomorrow. Pt will be made NPO after midnight  Ollen Gross 01/17/2022, 9:27 AM

## 2022-01-17 NOTE — Progress Notes (Signed)
Initial Nutrition Assessment  DOCUMENTATION CODES:   Non-severe (moderate) malnutrition in context of chronic illness  INTERVENTION:   -Ensure Plus High Protein po BID, each supplement provides 350 kcal and 20 grams of protein.   -Multivitamin with minerals daily  NUTRITION DIAGNOSIS:   Moderate Malnutrition related to chronic illness as evidenced by mild fat depletion, moderate muscle depletion.  GOAL:   Patient will meet greater than or equal to 90% of their needs  MONITOR:   PO intake, Supplement acceptance, Labs, Weight trends, I & O's  REASON FOR ASSESSMENT:   Consult Hip fracture protocol  ASSESSMENT:   76 y.o. female with medical history significant of hypertension, hyperlipidemia, seizure, CAD s/p stent placement and HFrEF who presents to the emergency department via EMS from home after sustaining a fall.  Patient states that she accidentally tripped on something at home and fell from standing position landing on her left hip  Patient reports poor appetite. However, she says she will eat whatever is given her. She has ordered an omelet for a late breakfast. She sometimes drinks Ensures at home and is agreeable to receiving them here.   Per weight records, weight is stable. UBW is between 132-136 lbs. Current weight: 136 lbs.  Medications: Lovaza  Labs reviewed:  Low K  NUTRITION - FOCUSED PHYSICAL EXAM:  Flowsheet Row Most Recent Value  Orbital Region Mild depletion  Upper Arm Region Mild depletion  Thoracic and Lumbar Region Mild depletion  Buccal Region Mild depletion  Temple Region Moderate depletion  Clavicle Bone Region Mild depletion  Clavicle and Acromion Bone Region Mild depletion  Scapular Bone Region Mild depletion  Dorsal Hand Moderate depletion  Patellar Region Unable to assess  Anterior Thigh Region Unable to assess  Posterior Calf Region Unable to assess  Edema (RD Assessment) None  Hair Reviewed  Eyes Reviewed  Mouth Reviewed  [no  teeth]  Skin Reviewed       Diet Order:   Diet Order             Diet NPO time specified  Diet effective midnight           Diet regular Room service appropriate? Yes; Fluid consistency: Thin  Diet effective now                   EDUCATION NEEDS:   No education needs have been identified at this time  Skin:  Skin Assessment: Reviewed RN Assessment  Last BM:  7/2  Height:   Ht Readings from Last 1 Encounters:  01/17/22 5\' 2"  (1.575 m)    Weight:   Wt Readings from Last 1 Encounters:  01/17/22 61.7 kg    BMI:  Body mass index is 24.87 kg/m.  Estimated Nutritional Needs:   Kcal:  1550-1750  Protein:  70-85g  Fluid:  1,7L/day   03/20/22, MS, RD, LDN Inpatient Clinical Dietitian Contact information available via Amion

## 2022-01-17 NOTE — Plan of Care (Signed)

## 2022-01-17 NOTE — Progress Notes (Signed)
PROGRESS NOTE Destiny Hoffman  OEH:[id number] DOB: 1946-02-25 DOA: 01/15/2022 PCP: Jani Gravel, MD   Brief Narrative/Hospital Course: 84-yof w/ CAD-MI s/p BMS LAD 2011 w/ small RCA 80% (med rx), HFrEF, hypertension, hyperlipidemia, seizure disorder presenting after mechanical fall-patient tripped over her cot at home landing on her left side/in ED- CT of the left hip showed left femoral neck fracture with mild impaction.  Orthopedics, Dr. Alfonse Spruce was consulted and requested transfer to [facility].labd done- EKG showed normal sinus rhythm.  There is no ST-T wave changes. Transferred to [place] per Dr. Alvan Dame.    Subjective: Seen and examined this morning.  Resting comfortably pain stable.  No new complaints.  Assessment and Plan: Principal Problem:   Fracture of femoral neck, left, closed ([place]) Active Problems:   Mixed hyperlipidemia   Coronary atherosclerosis of native coronary artery   Seizure ([place])   Fall at home, initial encounter   Chronic combined systolic and diastolic CHF (congestive heart failure) (HCC)   Essential hypertension   Hypokalemia    Fracture of femoral neck, left, closed Fall at home: Ortho on consult and planning operative intervention tomorrow.  Appears to be in moderate risk given patient's CAD, age, CHF-which appears compensated.  Does have an MET equivalent >4 as she was ambulatory prior to fall able to climb 2 flights of stairs without issues and walking well-we will get cardiology evaluation.Marland Kitchen  CAD s/p BMS LAD 2011 w/ small RCA 80% (med rx) Chronic combined systolic and diastolic CHF Ischemic cardiomyopathy: No chest pain CHF compensated.  Last echo from August 2022 with EF 30-25%.  Not on diuretics Continue aspirin Coreg and Crestor.  HTN HLD: BP controlled, continue Coreg and Crestor.    Seizure on phenobarbital. Hypokalemia: Repleted, recheck the patient Mild leukocytosis likely reactive from part.  Resolved.  Monitor  DVT prophylaxis: SCDs Start:  01/15/22 2111 Code Status:   Code Status: Full Code Family Communication: plan of care discussed with patient at bedside. Patient status is: Inpatient because of hip surgery Level of care: Med-Surg   Dispo: The patient is from: Home, has husband in home hospice for COPD            Anticipated disposition: SNF TBD  Mobility Assessment (last 72 hours)     Mobility Assessment     Row Name 01/16/22 2232           Does patient have an order for bedrest or is patient medically unstable No - Continue assessment       What is the highest level of mobility based on the progressive mobility assessment? Level 5 (Walks with assist in room/hall) - Balance while stepping forward/back and can walk in room with assist - Complete                 Objective: Vitals last 24 hrs: Vitals:   01/17/22 0003 01/17/22 0130 01/17/22 0633 01/17/22 1000  BP:  112/65 (!) 155/80 (!) 156/74  Pulse:  86 96 95  Resp:  '17 17 16  ' Temp:  98.8 F (37.1 C) 99 F (37.2 C) 99.5 F (37.5 C)  TempSrc:  Oral Oral Oral  SpO2:  94% 95% 97%  Weight: 61.7 kg     Height: '5\' 2"'  (1.575 m)      Weight change: 1.814 kg  Physical Examination: General exam: alert awake,older than stated age, weak appearing. HEENT:Oral mucosa moist, Ear/Nose WNL grossly, dentition normal. Respiratory system: bilaterally diminished BS, no use of accessory muscle Cardiovascular system: S1 &  S2 +, No JVD. Gastrointestinal system: Abdomen soft,NT,ND, BS+ Nervous System:Alert, awake, moving extremities and grossly nonfocal Extremities: LE edema NEG, tender left hip Skin: No rashes,no icterus. MSK: Normal muscle bulk,tone, power  Medications reviewed:  Scheduled Meds:  aspirin EC  81 mg Oral Daily   carvedilol  6.25 mg Oral BID   feeding supplement  237 mL Oral BID BM   multivitamin with minerals  1 tablet Oral Daily   omega-3 acid ethyl esters  1 g Oral Daily   PHENObarbital  30 mg Oral TID   rosuvastatin  20 mg Oral Daily    Continuous Infusions:    Diet Order             Diet NPO time specified  Diet effective midnight           Diet regular Room service appropriate? Yes; Fluid consistency: Thin  Diet effective now                            Intake/Output Summary (Last 24 hours) at 01/17/2022 1044 Last data filed at 01/17/2022 0014 Gross per 24 hour  Intake --  Output 200 ml  Net -200 ml   Net IO Since Admission: -607.02 mL [01/17/22 1044]  Wt Readings from Last 3 Encounters:  01/17/22 61.7 kg  09/16/21 61.8 kg  02/02/21 59.4 kg     Unresulted Labs (From admission, onward)    None     Data Reviewed: I have personally reviewed following labs and imaging studies CBC: Recent Labs  Lab 01/15/22 1916 01/16/22 0423  WBC 11.1* 9.8  NEUTROABS 9.5*  --   HGB 12.5 11.6*  HCT 38.5 36.4  MCV 98.2 97.8  PLT 188 947   Basic Metabolic Panel: Recent Labs  Lab 01/15/22 1916 01/16/22 0423  NA 139 136  K 4.5 3.4*  CL 106 106  CO2 26 22  GLUCOSE 120* 102*  BUN 11 11  CREATININE 0.56 0.57  CALCIUM 9.3 8.6*   GFR: Estimated Creatinine Clearance: 51.7 mL/min (by C-G formula based on SCr of 0.57 mg/dL). Liver Function Tests: No results for input(s): "AST", "ALT", "ALKPHOS", "BILITOT", "PROT", "ALBUMIN" in the last 168 hours. No results for input(s): "LIPASE", "AMYLASE" in the last 168 hours. No results for input(s): "AMMONIA" in the last 168 hours. Coagulation Profile: Recent Labs  Lab 01/15/22 1916  INR 1.0   BNP (last 3 results) No results for input(s): "PROBNP" in the last 8760 hours. HbA1C: No results for input(s): "HGBA1C" in the last 72 hours. CBG: No results for input(s): "GLUCAP" in the last 168 hours. Lipid Profile: No results for input(s): "CHOL", "HDL", "LDLCALC", "TRIG", "CHOLHDL", "LDLDIRECT" in the last 72 hours. Thyroid Function Tests: No results for input(s): "TSH", "T4TOTAL", "FREET4", "T3FREE", "THYROIDAB" in the last 72 hours. Sepsis Labs: No results  for input(s): "PROCALCITON", "LATICACIDVEN" in the last 168 hours.  No results found for this or any previous visit (from the past 240 hour(s)).  Antimicrobials: Anti-infectives (From admission, onward)    None      Culture/Microbiology No results found for: "SDES", "SPECREQUEST", "CULT", "REPTSTATUS"  Other culture-see note  Radiology Studies: CT HIP LEFT WO CONTRAST  Result Date: 01/15/2022 CLINICAL DATA:  Left hip fracture EXAM: CT OF THE LEFT HIP WITHOUT CONTRAST TECHNIQUE: Multidetector CT imaging of the left hip was performed according to the standard protocol. Multiplanar CT image reconstructions were also generated. RADIATION DOSE REDUCTION: This exam was performed according  to the departmental dose-optimization program which includes automated exposure control, adjustment of the mA and/or kV according to patient size and/or use of iterative reconstruction technique. COMPARISON:  Same-day x-ray FINDINGS: Bones/Joint/Cartilage Acute transcervical left femoral neck fracture with mild impaction. No significant displacement or angulation. Fracture extends to the lateral most margin of the femoral head which may involve the articular surface (series 6, image 59). No fracture involvement of the intertrochanteric region. Hip joint alignment is maintained without dislocation. Joint space is relatively preserved with mild marginal osteophyte formation. Moderate-sized hip joint effusion. Included portion of the left hemipelvis is intact. No additional fractures are seen. No pelvic diastasis. Ligaments Suboptimally assessed by CT. Muscles and Tendons No acute musculotendinous abnormality by CT. Soft tissues No hematoma within the soft tissues. No left inguinal lymphadenopathy. Diverticular changes of the sigmoid colon. No acute findings within the left hemipelvis. IMPRESSION: 1. Acute transcervical left femoral neck fracture with mild impaction. Fracture extends to the lateral most margin of the femoral  head which may involve the articular surface. 2. Moderate-sized hip joint effusion. Electronically Signed   By: Davina Poke D.O.   On: 01/15/2022 21:36   DG Hip Unilat W or Wo Pelvis 2-3 Views Left  Result Date: 01/15/2022 CLINICAL DATA:  Left hip pain.  Fall EXAM: DG HIP (WITH OR WITHOUT PELVIS) 2-3V LEFT COMPARISON:  None Available. FINDINGS: Acute minimally impacted left femoral neck fracture. No significant displacement or angulation. Bones are demineralized. No dislocation. IMPRESSION: Acute minimally impacted left femoral neck fracture. Electronically Signed   By: Davina Poke D.O.   On: 01/15/2022 18:45   DG Knee Complete 4 Views Left  Result Date: 01/15/2022 CLINICAL DATA:  left hip pain EXAM: LEFT KNEE - COMPLETE 4+ VIEW COMPARISON:  None Available. FINDINGS: Decreased bone density. No evidence of fracture, dislocation, or joint effusion. No evidence of arthropathy or other focal bone abnormality. Soft tissues are unremarkable. IMPRESSION: No acute displaced fracture or dislocation. Electronically Signed   By: Iven Finn M.D.   On: 01/15/2022 18:45     LOS: 2 days   Antonieta Pert, MD [facility]  01/17/2022, 10:44 AM

## 2022-01-18 ENCOUNTER — Inpatient Hospital Stay (HOSPITAL_COMMUNITY): Payer: Medicare Other | Admitting: Anesthesiology

## 2022-01-18 ENCOUNTER — Encounter (HOSPITAL_COMMUNITY)
Admission: EM | Disposition: A | Discharge status: Skilled Nursing Facility | Payer: Self-pay | Source: Home / Self Care | Attending: Internal Medicine

## 2022-01-18 ENCOUNTER — Inpatient Hospital Stay (HOSPITAL_COMMUNITY): Payer: Medicare Other

## 2022-01-18 ENCOUNTER — Encounter (HOSPITAL_COMMUNITY): Payer: Self-pay | Admitting: Internal Medicine

## 2022-01-18 ENCOUNTER — Other Ambulatory Visit: Payer: Self-pay

## 2022-01-18 DIAGNOSIS — S72002A Fracture of unspecified part of neck of left femur, initial encounter for closed fracture: Secondary | ICD-10-CM

## 2022-01-18 DIAGNOSIS — I5042 Chronic combined systolic (congestive) and diastolic (congestive) heart failure: Secondary | ICD-10-CM | POA: Diagnosis not present

## 2022-01-18 DIAGNOSIS — E440 Moderate protein-calorie malnutrition: Secondary | ICD-10-CM | POA: Insufficient documentation

## 2022-01-18 DIAGNOSIS — I2510 Atherosclerotic heart disease of native coronary artery without angina pectoris: Secondary | ICD-10-CM | POA: Diagnosis not present

## 2022-01-18 DIAGNOSIS — Z87891 Personal history of nicotine dependence: Secondary | ICD-10-CM

## 2022-01-18 DIAGNOSIS — I110 Hypertensive heart disease with heart failure: Secondary | ICD-10-CM

## 2022-01-18 DIAGNOSIS — I509 Heart failure, unspecified: Secondary | ICD-10-CM

## 2022-01-18 HISTORY — PX: TOTAL HIP ARTHROPLASTY: SHX124

## 2022-01-18 LAB — BASIC METABOLIC PANEL
Anion gap: 7 (ref 5–15)
BUN: 15 mg/dL (ref 8–23)
CO2: 28 mmol/L (ref 22–32)
Calcium: 8.8 mg/dL — ABNORMAL LOW (ref 8.9–10.3)
Chloride: 106 mmol/L (ref 98–111)
Creatinine, Ser: 0.58 mg/dL (ref 0.44–1.00)
GFR, Estimated: >60 mL/min (ref >60)
Glucose, Bld: 113 mg/dL — ABNORMAL HIGH (ref 70–99)
Potassium: 3.9 mmol/L (ref 3.5–5.1)
Sodium: 141 mmol/L (ref 135–145)

## 2022-01-18 SURGERY — ARTHROPLASTY, HIP, TOTAL, ANTERIOR APPROACH
Anesthesia: Spinal | Site: Hip | Laterality: Left

## 2022-01-18 MED ORDER — ONDANSETRON HCL 4 MG/2ML IJ SOLN
INTRAMUSCULAR | Status: DC | PRN
  Administered 2022-01-18: 4 mg via INTRAVENOUS

## 2022-01-18 MED ORDER — ONDANSETRON HCL 4 MG/2ML IJ SOLN: 4.0000 mg | Freq: Four times a day (QID) | INTRAMUSCULAR | Status: DC | PRN

## 2022-01-18 MED ORDER — METHOCARBAMOL 500 MG PO TABS
500.0000 mg | ORAL_TABLET | Freq: Four times a day (QID) | ORAL | Status: DC | PRN
  Administered 2022-01-18 – 2022-01-19 (×3): 500 mg via ORAL
  Filled 2022-01-18 (×3): qty 1

## 2022-01-18 MED ORDER — SODIUM CHLORIDE 0.9 % IV SOLN: INTRAVENOUS | Status: DC

## 2022-01-18 MED ORDER — METOCLOPRAMIDE HCL 5 MG PO TABS: 5.0000 mg | ORAL_TABLET | Freq: Three times a day (TID) | ORAL | Status: DC | PRN

## 2022-01-18 MED ORDER — ASPIRIN 81 MG PO CHEW
81.0000 mg | CHEWABLE_TABLET | Freq: Two times a day (BID) | ORAL | Status: DC
  Administered 2022-01-18 – 2022-01-19 (×3): 81 mg via ORAL
  Filled 2022-01-18 (×3): qty 1

## 2022-01-18 MED ORDER — LIDOCAINE HCL (PF) 2 % IJ SOLN
INTRAMUSCULAR | Status: AC
  Filled 2022-01-18: qty 5

## 2022-01-18 MED ORDER — PHENYLEPHRINE 80 MCG/ML (10ML) SYRINGE FOR IV PUSH (FOR BLOOD PRESSURE SUPPORT)
PREFILLED_SYRINGE | INTRAVENOUS | Status: AC
  Filled 2022-01-18: qty 10

## 2022-01-18 MED ORDER — BUPIVACAINE IN DEXTROSE 0.75-8.25 % IT SOLN
INTRATHECAL | Status: DC | PRN
  Administered 2022-01-18: 1.6 mL via INTRATHECAL

## 2022-01-18 MED ORDER — TRANEXAMIC ACID-NACL 1000-0.7 MG/100ML-% IV SOLN
1000.0000 mg | INTRAVENOUS | Status: AC
  Administered 2022-01-18: 1000 mg via INTRAVENOUS

## 2022-01-18 MED ORDER — CEFAZOLIN SODIUM-DEXTROSE 2-4 GM/100ML-% IV SOLN
INTRAVENOUS | Status: AC
  Filled 2022-01-18: qty 100

## 2022-01-18 MED ORDER — DEXAMETHASONE SODIUM PHOSPHATE 4 MG/ML IJ SOLN
INTRAMUSCULAR | Status: DC | PRN
  Administered 2022-01-18: 5 mg via INTRAVENOUS

## 2022-01-18 MED ORDER — SENNA 8.6 MG PO TABS
1.0000 | ORAL_TABLET | Freq: Two times a day (BID) | ORAL | Status: DC
  Administered 2022-01-18 – 2022-01-20 (×4): 8.6 mg via ORAL
  Filled 2022-01-18 (×4): qty 1

## 2022-01-18 MED ORDER — SODIUM CHLORIDE (PF) 0.9 % IJ SOLN
INTRAMUSCULAR | Status: DC | PRN
  Administered 2022-01-18: 30 mL via INTRAVENOUS

## 2022-01-18 MED ORDER — ALUM & MAG HYDROXIDE-SIMETH 200-200-20 MG/5ML PO SUSP: 30.0000 mL | ORAL | Status: DC | PRN

## 2022-01-18 MED ORDER — ACETAMINOPHEN 500 MG PO TABS
ORAL_TABLET | ORAL | Status: AC
  Filled 2022-01-18: qty 2

## 2022-01-18 MED ORDER — METOCLOPRAMIDE HCL 5 MG/ML IJ SOLN: 5.0000 mg | Freq: Three times a day (TID) | INTRAMUSCULAR | Status: DC | PRN

## 2022-01-18 MED ORDER — CEFAZOLIN SODIUM-DEXTROSE 2-4 GM/100ML-% IV SOLN
2.0000 g | INTRAVENOUS | Status: AC
  Administered 2022-01-18: 2 g via INTRAVENOUS

## 2022-01-18 MED ORDER — LACTATED RINGERS IV SOLN: INTRAVENOUS | Status: DC | PRN

## 2022-01-18 MED ORDER — CEFAZOLIN SODIUM-DEXTROSE 2-4 GM/100ML-% IV SOLN
2.0000 g | Freq: Four times a day (QID) | INTRAVENOUS | Status: AC
  Administered 2022-01-18 – 2022-01-19 (×2): 2 g via INTRAVENOUS
  Filled 2022-01-18 (×2): qty 100

## 2022-01-18 MED ORDER — PROPOFOL 10 MG/ML IV BOLUS
INTRAVENOUS | Status: DC | PRN
  Administered 2022-01-18 (×2): 20 mg via INTRAVENOUS

## 2022-01-18 MED ORDER — ONDANSETRON HCL 4 MG/2ML IJ SOLN
INTRAMUSCULAR | Status: AC
  Filled 2022-01-18: qty 2

## 2022-01-18 MED ORDER — BUPIVACAINE-EPINEPHRINE 0.25% -1:200000 IJ SOLN
INTRAMUSCULAR | Status: AC
  Filled 2022-01-18: qty 1

## 2022-01-18 MED ORDER — ISOPROPYL ALCOHOL 70 % SOLN
Status: DC | PRN
  Administered 2022-01-18: 1 via TOPICAL

## 2022-01-18 MED ORDER — MENTHOL 3 MG MT LOZG: 1.0000 | LOZENGE | OROMUCOSAL | Status: DC | PRN

## 2022-01-18 MED ORDER — PROPOFOL 1000 MG/100ML IV EMUL
INTRAVENOUS | Status: AC
  Filled 2022-01-18: qty 100

## 2022-01-18 MED ORDER — TRANEXAMIC ACID-NACL 1000-0.7 MG/100ML-% IV SOLN
INTRAVENOUS | Status: AC
  Filled 2022-01-18: qty 100

## 2022-01-18 MED ORDER — FENTANYL CITRATE PF 50 MCG/ML IJ SOSY: 25.0000 ug | PREFILLED_SYRINGE | INTRAMUSCULAR | Status: DC | PRN

## 2022-01-18 MED ORDER — SODIUM CHLORIDE 0.9 % IR SOLN
Status: DC | PRN
  Administered 2022-01-18: 1000 mL

## 2022-01-18 MED ORDER — POVIDONE-IODINE 10 % EX SWAB: 2.0000 | Freq: Once | CUTANEOUS | Status: AC

## 2022-01-18 MED ORDER — SODIUM CHLORIDE (PF) 0.9 % IJ SOLN
INTRAMUSCULAR | Status: AC
  Filled 2022-01-18: qty 50

## 2022-01-18 MED ORDER — PHENYLEPHRINE HCL-NACL 20-0.9 MG/250ML-% IV SOLN
INTRAVENOUS | Status: DC | PRN
  Administered 2022-01-18: 30 ug/min via INTRAVENOUS

## 2022-01-18 MED ORDER — PROPOFOL 500 MG/50ML IV EMUL
INTRAVENOUS | Status: DC | PRN
  Administered 2022-01-18: 75 ug/kg/min via INTRAVENOUS

## 2022-01-18 MED ORDER — BUPIVACAINE-EPINEPHRINE 0.25% -1:200000 IJ SOLN
INTRAMUSCULAR | Status: DC | PRN
  Administered 2022-01-18: 30 mL

## 2022-01-18 MED ORDER — LIDOCAINE 2% (20 MG/ML) 5 ML SYRINGE
INTRAMUSCULAR | Status: DC | PRN
  Administered 2022-01-18: 30 mg via INTRAVENOUS

## 2022-01-18 MED ORDER — ORAL CARE MOUTH RINSE: 15.0000 mL | OROMUCOSAL | Status: DC | PRN

## 2022-01-18 MED ORDER — WATER FOR IRRIGATION, STERILE IR SOLN
Status: DC | PRN
  Administered 2022-01-18: 1000 mL

## 2022-01-18 MED ORDER — DEXAMETHASONE SODIUM PHOSPHATE 10 MG/ML IJ SOLN
INTRAMUSCULAR | Status: AC
  Filled 2022-01-18: qty 1

## 2022-01-18 MED ORDER — ONDANSETRON HCL 4 MG PO TABS: 4.0000 mg | ORAL_TABLET | Freq: Four times a day (QID) | ORAL | Status: DC | PRN

## 2022-01-18 MED ORDER — CHLORHEXIDINE GLUCONATE CLOTH 2 % EX PADS
6.0000 | MEDICATED_PAD | Freq: Every day | CUTANEOUS | Status: DC
  Administered 2022-01-19: 6 via TOPICAL

## 2022-01-18 MED ORDER — KETOROLAC TROMETHAMINE 30 MG/ML IJ SOLN
INTRAMUSCULAR | Status: DC | PRN
  Administered 2022-01-18: 30 mg via INTRAVENOUS

## 2022-01-18 MED ORDER — POLYETHYLENE GLYCOL 3350 17 G PO PACK: 17.0000 g | PACK | Freq: Every day | ORAL | Status: DC | PRN

## 2022-01-18 MED ORDER — CHLORHEXIDINE GLUCONATE 4 % EX LIQD: 60.0000 mL | Freq: Once | CUTANEOUS | Status: AC

## 2022-01-18 MED ORDER — ACETAMINOPHEN 500 MG PO TABS
1000.0000 mg | ORAL_TABLET | Freq: Once | ORAL | Status: AC
  Administered 2022-01-18: 1000 mg via ORAL

## 2022-01-18 MED ORDER — FENTANYL CITRATE (PF) 100 MCG/2ML IJ SOLN
INTRAMUSCULAR | Status: AC
  Filled 2022-01-18: qty 2

## 2022-01-18 MED ORDER — POVIDONE-IODINE 10 % EX SWAB
2.0000 | Freq: Once | CUTANEOUS | Status: AC
  Administered 2022-01-18: 2 via TOPICAL

## 2022-01-18 MED ORDER — PHENOL 1.4 % MT LIQD: 1.0000 | OROMUCOSAL | Status: DC | PRN

## 2022-01-18 MED ORDER — KETOROLAC TROMETHAMINE 30 MG/ML IJ SOLN
INTRAMUSCULAR | Status: AC
  Filled 2022-01-18: qty 1

## 2022-01-18 MED ORDER — DIPHENHYDRAMINE HCL 12.5 MG/5ML PO ELIX: 12.5000 mg | ORAL_SOLUTION | ORAL | Status: DC | PRN

## 2022-01-18 MED ORDER — METHOCARBAMOL 500 MG IVPB - SIMPLE MED: 500.0000 mg | Freq: Four times a day (QID) | INTRAVENOUS | Status: DC | PRN

## 2022-01-18 MED ORDER — DOCUSATE SODIUM 100 MG PO CAPS
100.0000 mg | ORAL_CAPSULE | Freq: Two times a day (BID) | ORAL | Status: DC
  Administered 2022-01-18 – 2022-01-20 (×4): 100 mg via ORAL
  Filled 2022-01-18 (×4): qty 1

## 2022-01-18 MED ORDER — FENTANYL CITRATE (PF) 100 MCG/2ML IJ SOLN
INTRAMUSCULAR | Status: DC | PRN
Start: 2022-01-18 — End: 2022-01-18
  Administered 2022-01-18: 50 ug via INTRAVENOUS

## 2022-01-18 SURGICAL SUPPLY — 64 items
ACE SHELL 3H 52 E HIP (Shell) ×2 IMPLANT
ADH SKN CLS APL DERMABOND .7 (GAUZE/BANDAGES/DRESSINGS) ×1
APL PRP STRL LF DISP 70% ISPRP (MISCELLANEOUS) ×1
BAG COUNTER SPONGE SURGICOUNT (BAG) IMPLANT
BAG DECANTER FOR FLEXI CONT (MISCELLANEOUS) IMPLANT
BAG SPEC THK2 15X12 ZIP CLS (MISCELLANEOUS)
BAG SPNG CNTER NS LX DISP (BAG)
BAG ZIPLOCK 12X15 (MISCELLANEOUS) IMPLANT
CHLORAPREP W/TINT 26 (MISCELLANEOUS) ×2 IMPLANT
COVER PERINEAL POST (MISCELLANEOUS) ×2 IMPLANT
COVER SURGICAL LIGHT HANDLE (MISCELLANEOUS) ×2 IMPLANT
DERMABOND ADVANCED (GAUZE/BANDAGES/DRESSINGS) ×1
DERMABOND ADVANCED .7 DNX12 (GAUZE/BANDAGES/DRESSINGS) ×2 IMPLANT
DRAPE IMP U-DRAPE 54X76 (DRAPES) ×2 IMPLANT
DRAPE SHEET LG 3/4 BI-LAMINATE (DRAPES) ×6 IMPLANT
DRAPE STERI IOBAN 125X83 (DRAPES) ×2 IMPLANT
DRAPE U-SHAPE 47X51 STRL (DRAPES) ×4 IMPLANT
DRSG AQUACEL AG ADV 3.5X10 (GAUZE/BANDAGES/DRESSINGS) ×2 IMPLANT
ELECT REM PT RETURN 15FT ADLT (MISCELLANEOUS) ×2 IMPLANT
GAUZE SPONGE 4X4 12PLY STRL (GAUZE/BANDAGES/DRESSINGS) ×2 IMPLANT
GLOVE BIO SURGEON STRL SZ8.5 (GLOVE) ×4 IMPLANT
GLOVE BIOGEL M 7.0 STRL (GLOVE) ×2 IMPLANT
GLOVE BIOGEL PI IND STRL 7.5 (GLOVE) ×1 IMPLANT
GLOVE BIOGEL PI IND STRL 8.5 (GLOVE) ×1 IMPLANT
GLOVE BIOGEL PI INDICATOR 7.5 (GLOVE) ×1
GLOVE BIOGEL PI INDICATOR 8.5 (GLOVE) ×1
GLOVE SURG LX 7.5 STRW (GLOVE) ×2
GLOVE SURG LX STRL 7.5 STRW (GLOVE) ×2 IMPLANT
GOWN SPEC L3 XXLG W/TWL (GOWN DISPOSABLE) ×2 IMPLANT
GOWN STRL REUS W/ TWL XL LVL3 (GOWN DISPOSABLE) ×1 IMPLANT
GOWN STRL REUS W/TWL XL LVL3 (GOWN DISPOSABLE) ×2
HANDPIECE INTERPULSE COAX TIP (DISPOSABLE) ×2
HEAD FEM -3XOFST 36XMDLR (Head) IMPLANT
HEAD MODULAR 36MM (Head) ×2 IMPLANT
HOLDER FOLEY CATH W/STRAP (MISCELLANEOUS) ×2 IMPLANT
HOOD PEEL AWAY FLYTE STAYCOOL (MISCELLANEOUS) ×6 IMPLANT
KIT TURNOVER KIT A (KITS) IMPLANT
LINER ACE G7 HIGH 36 SZ E (Liner) ×1 IMPLANT
MANIFOLD NEPTUNE II (INSTRUMENTS) ×2 IMPLANT
MARKER SKIN DUAL TIP RULER LAB (MISCELLANEOUS) ×2 IMPLANT
NDL SAFETY ECLIPSE 18X1.5 (NEEDLE) ×1 IMPLANT
NDL SPNL 18GX3.5 QUINCKE PK (NEEDLE) ×1 IMPLANT
NEEDLE HYPO 18GX1.5 SHARP (NEEDLE) ×2
NEEDLE SPNL 18GX3.5 QUINCKE PK (NEEDLE) ×2 IMPLANT
PACK ANTERIOR HIP CUSTOM (KITS) ×2 IMPLANT
PENCIL SMOKE EVACUATOR (MISCELLANEOUS) IMPLANT
SAW OSC TIP CART 19.5X105X1.3 (SAW) ×2 IMPLANT
SEALER BIPOLAR AQUA 6.0 (INSTRUMENTS) ×2 IMPLANT
SET HNDPC FAN SPRY TIP SCT (DISPOSABLE) ×1 IMPLANT
SHELL ACETAB 3H 52 E HIP (Shell) IMPLANT
SOLUTION PRONTOSAN WOUND 350ML (IRRIGATION / IRRIGATOR) ×2 IMPLANT
SPIKE FLUID TRANSFER (MISCELLANEOUS) ×2 IMPLANT
STEM FEM CMTLS HIP 14X148 123D (Stem) ×1 IMPLANT
SUT MNCRL AB 3-0 PS2 18 (SUTURE) ×2 IMPLANT
SUT MON AB 2-0 CT1 36 (SUTURE) ×2 IMPLANT
SUT STRATAFIX PDO 1 14 VIOLET (SUTURE) ×2
SUT STRATFX PDO 1 14 VIOLET (SUTURE) ×1
SUT VIC AB 2-0 CT1 27 (SUTURE)
SUT VIC AB 2-0 CT1 TAPERPNT 27 (SUTURE) IMPLANT
SUTURE STRATFX PDO 1 14 VIOLET (SUTURE) ×1 IMPLANT
SYR 3ML LL SCALE MARK (SYRINGE) ×2 IMPLANT
TRAY FOLEY MTR SLVR 16FR STAT (SET/KITS/TRAYS/PACK) IMPLANT
TUBE SUCTION HIGH CAP CLEAR NV (SUCTIONS) ×2 IMPLANT
WATER STERILE IRR 1000ML POUR (IV SOLUTION) ×2 IMPLANT

## 2022-01-18 NOTE — Anesthesia Procedure Notes (Signed)
Date/Time: 01/18/2022 3:13 PM  Performed by: Vanessa [place], CRNA

## 2022-01-18 NOTE — Transfer of Care (Signed)
Immediate Anesthesia Transfer of Care Note  Patient: Destiny Hoffman  Procedure(s) Performed: TOTAL HIP ARTHROPLASTY ANTERIOR APPROACH (Left: Hip)  Patient Location: PACU  Anesthesia Type:Spinal  Level of Consciousness: awake and patient cooperative  Airway & Oxygen Therapy: Patient Spontanous Breathing and aerosol face mask  Post-op Assessment: Report given to RN and Post -op Vital signs reviewed and stable  Post vital signs: Reviewed and stable  Last Vitals:  Vitals Value Taken Time  BP 106/77 01/18/22 1655  Temp    Pulse 81 01/18/22 1658  Resp 17 01/18/22 1658  SpO2 99 % 01/18/22 1658  Vitals shown include unvalidated device data.  Last Pain:  Vitals:   01/18/22 1425  TempSrc: Oral  PainSc: 0-No pain      Patients Stated Pain Goal: 3 (01/18/22 1030)  Complications: No notable events documented.

## 2022-01-18 NOTE — Anesthesia Preprocedure Evaluation (Addendum)
Anesthesia Evaluation  Patient identified by MRN, date of birth, ID band Patient awake    Reviewed: Allergy & Precautions, NPO status , Patient's Chart, lab work & pertinent test results, reviewed documented beta blocker date and time   Airway Mallampati: III  TM Distance: >3 FB Neck ROM: Full    Dental  (+) Edentulous Lower, Missing, Dental Advisory Given,    Pulmonary neg pulmonary ROS, former smoker,    Pulmonary exam normal breath sounds clear to auscultation       Cardiovascular hypertension, Pt. on home beta blockers and Pt. on medications + CAD, + Past MI, + Cardiac Stents and +CHF  Normal cardiovascular exam Rhythm:Regular Rate:Normal  TTE 2022 1. Left ventricular ejection fraction, by estimation, is 30 to 35%. The  left ventricle has moderately decreased function. The left ventricle  demonstrates regional wall motion abnormalities (see scoring  diagram/findings for description). Left ventricular  diastolic parameters are consistent with Grade I diastolic dysfunction  (impaired relaxation). Calcified trabeculation near apex without obvious  thrombus.  2. Right ventricular systolic function is normal. The right ventricular  size is normal. There is normal pulmonary artery systolic pressure. The  estimated right ventricular systolic pressure is 28.6 mmHg.  3. Left atrial size was mildly dilated.  4. The mitral valve is abnormal. Mild mitral valve regurgitation. The  mean mitral valve gradient is 2.0 mmHg.  5. Tricuspid valve regurgitation is moderate.  6. The aortic valve is tricuspid. Aortic valve regurgitation is not  visualized. Aortic valve mean gradient measures 3.0 mmHg.  7. The inferior vena cava is normal in size with greater than 50%  respiratory variability, suggesting right atrial pressure of 3 mmHg.   Neuro/Psych Seizures -,  negative psych ROS   GI/Hepatic negative GI ROS, Neg liver ROS,    Endo/Other  negative endocrine ROS  Renal/GU negative Renal ROS  negative genitourinary   Musculoskeletal negative musculoskeletal ROS (+)   Abdominal   Peds  Hematology negative hematology ROS (+)   Anesthesia Other Findings   Reproductive/Obstetrics                          Anesthesia Physical Anesthesia Plan  ASA: 3  Anesthesia Plan: Spinal   Post-op Pain Management: Tylenol PO (pre-op)*   Induction:   PONV Risk Score and Plan: 2 and Treatment may vary due to age or medical condition, Ondansetron and Dexamethasone  Airway Management Planned: Natural Airway  Additional Equipment:   Intra-op Plan:   Post-operative Plan:   Informed Consent: I have reviewed the patients History and Physical, chart, labs and discussed the procedure including the risks, benefits and alternatives for the proposed anesthesia with the patient or authorized representative who has indicated his/her understanding and acceptance.     Dental advisory given  Plan Discussed with: CRNA  Anesthesia Plan Comments:        Anesthesia Quick Evaluation

## 2022-01-18 NOTE — Anesthesia Procedure Notes (Signed)
Spinal  Patient location during procedure: OR Start time: 01/18/2022 3:05 PM End time: 01/18/2022 3:07 PM Reason for block: surgical anesthesia Staffing Performed: anesthesiologist  Anesthesiologist: Elmer Picker, MD Performed by: Elmer Picker, MD Authorized by: Elmer Picker, MD   Preanesthetic Checklist Completed: patient identified, IV checked, risks and benefits discussed, surgical consent, monitors and equipment checked, pre-op evaluation and timeout performed Spinal Block Patient position: sitting Prep: DuraPrep and site prepped and draped Patient monitoring: cardiac monitor, continuous pulse ox and blood pressure Approach: midline Location: L3-4 Injection technique: single-shot Needle Needle type: Pencan  Needle gauge: 24 G Needle length: 9 cm Assessment Sensory level: T6 Events: CSF return Additional Notes Functioning IV was confirmed and monitors were applied. Sterile prep and drape, including hand hygiene and sterile gloves were used. The patient was positioned and the spine was prepped. The skin was anesthetized with lidocaine.  Free flow of clear CSF was obtained prior to injecting local anesthetic into the CSF.  The spinal needle aspirated freely following injection.  The needle was carefully withdrawn.  The patient tolerated the procedure well.

## 2022-01-18 NOTE — Interval H&P Note (Signed)
History and Physical Interval Note:  01/18/2022 2:33 PM  Destiny Hoffman  has presented today for surgery, with the diagnosis of left hip fracture.  The various methods of treatment have been discussed with the patient and family. After consideration of risks, benefits and other options for treatment, the patient has consented to  Procedure(s): TOTAL HIP ARTHROPLASTY ANTERIOR APPROACH (Left) as a surgical intervention.  The patient's history has been reviewed, patient examined, no change in status, stable for surgery.  I have reviewed the patient's chart and labs.  Questions were answered to the patient's satisfaction.    The risks, benefits, and alternatives were discussed with the patient. There are risks associated with the surgery including, but not limited to, problems with anesthesia (death), infection, instability (giving out of the joint), dislocation, differences in leg length/angulation/rotation, fracture of bones, loosening or failure of implants, hematoma (blood accumulation) which may require surgical drainage, blood clots, pulmonary embolism, nerve injury (foot drop and lateral thigh numbness), and blood vessel injury. The patient understands these risks and elects to proceed.    Iline Oven Katalena Malveaux

## 2022-01-18 NOTE — Progress Notes (Signed)
No cardiovascular complaints.  Lying fully flat in bed without dyspnea.  Clinically she appears euvolemic. Awaiting hip surgery scheduled for late this afternoon.  We will follow-up after the surgical procedure.

## 2022-01-18 NOTE — Progress Notes (Signed)
PROGRESS NOTE Rubye A Harper  MRN:[id number] DOB: 11/09/1945 DOA: 01/15/2022 PCP: Kim, James, MD   Brief Narrative/Hospital Course: 75-yof w/ CAD-MI s/p BMS LAD 2011 w/ small RCA 80% (med rx), HFrEF, hypertension, hyperlipidemia, seizure disorder presenting after mechanical fall-patient tripped over her cot at home landing on her left side/in ED- CT of the left hip showed left femoral neck fracture with mild impaction.  Orthopedics, Dr. Nguyen was consulted and requested transfer to [facility].labd done- EKG showed normal sinus rhythm.  There is no ST-T wave changes. Transferred to [facility] per Dr. Olin.    Subjective: Seen this morning.  She is resting comfortably feels cold on the left leg otherwise no new complaints Overnight events afebrile. BP controlled BMP with stable renal function hypokalemia resolved  Assessment and Plan: Principal Problem:   Fracture of femoral neck, left, closed (HCC) Active Problems:   Mixed hyperlipidemia   Coronary atherosclerosis of native coronary artery   Seizure (HCC)   Fall at home, initial encounter   Chronic combined systolic and diastolic CHF (congestive heart failure) (HCC)   Essential hypertension   Hypokalemia   Malnutrition of moderate degree   Fracture of femoral neck, left, closed Fall at home: Appears to be in moderate risk given patient's CAD, age, CHF-which appears compensated, w. MET >4-cardio preop eval appreciated.  Ortho on consult for operative intervention today. DVT prophylaxis with SCD for now, chemical prophylaxis 24 hours postop per orthopedics.  CAD s/p BMS LAD 2011 w/ small RCA 80% (med rx) Chronic combined systolic and diastolic CHF Ischemic cardiomyopathy: No chest pain, CHF is compensated.Last echo from August 2022 with EF 30-25%.  Not on diuretics Continue aspirin Coreg and Crestor.  Appreciate cardiology inputs.  HTN HLD: BP well controlled, continue Coreg and Crestor.    Seizure on phenobarbital. Hypokalemia:  Resolved  Mild leukocytosis likely reactive from part.  Resolved.  Monitor  DVT prophylaxis: SCDs Start: 01/15/22 2111 Code Status:   Code Status: Full Code Family Communication: plan of care discussed with patient at bedside. Patient status is: Inpatient because of hip surgery  Level of care: Med-Surg  Dispo: The patient is from: Home, has husband in home hospice for COPD            Anticipated disposition: SNF unknown  Mobility Assessment (last 72 hours)     Mobility Assessment     Row Name 01/17/22 2152 01/17/22 1000 01/16/22 2232       Does patient have an order for bedrest or is patient medically unstable Yes- Bedfast (Level 1) - Complete No - Continue assessment No - Continue assessment     What is the highest level of mobility based on the progressive mobility assessment? Level 1 (Bedfast) - Unable to balance while sitting on edge of bed Level 1 (Bedfast) - Unable to balance while sitting on edge of bed Level 5 (Walks with assist in room/hall) - Balance while stepping forward/back and can walk in room with assist - Complete     Is the above level different from baseline mobility prior to current illness? No - Consider discontinuing PT/OT -- --               Objective: Vitals last 24 hrs: Vitals:   01/17/22 1403 01/17/22 2135 01/18/22 0535 01/18/22 0625  BP: 117/60 (!) 119/59  115/65  Pulse: 84 89  78  Resp: 18 18  18  Temp: 98.5 F (36.9 C) 98.6 F (37 C)  98.2 F (36.8 C)  TempSrc:   Oral Oral    SpO2: 91% 96%  95%  Weight:   63.7 kg   Height:       Weight change: 2.041 kg  Physical Examination: General exam: AA, elderly, pleasant  HEENT:Oral mucosa moist, Ear/Nose WNL grossly, dentition normal. Respiratory system: bilaterally diminished,no use of accessory muscle Cardiovascular system: S1 & S2 +, No JVD,. Gastrointestinal system: Abdomen soft,NT,ND, BS+ Nervous System:Alert, awake, moving extremities and grossly nonfocal Extremities: left leg externally  rotated tenderness in the left hip area.   Skin: No rashes,no icterus. MSK: Normal muscle bulk,tone, power.  Medications reviewed:  Scheduled Meds:  aspirin EC  81 mg Oral Daily   carvedilol  6.25 mg Oral BID   Chlorhexidine Gluconate Cloth  6 each Topical Q0600   feeding supplement  237 mL Oral BID BM   multivitamin with minerals  1 tablet Oral Daily   mupirocin ointment  1 Application Nasal BID   omega-3 acid ethyl esters  1 g Oral Daily   PHENObarbital  30 mg Oral TID   rosuvastatin  20 mg Oral Daily   Continuous Infusions:    Diet Order             Diet NPO time specified Except for: Sips with Meds  Diet effective now                    Nutrition Problem: Moderate Malnutrition Etiology: chronic illness Signs/Symptoms: mild fat depletion, moderate muscle depletion Interventions: Ensure Enlive (each supplement provides 350kcal and 20 grams of protein), MVI   Intake/Output Summary (Last 24 hours) at 01/18/2022 1038 Last data filed at 01/18/2022 0600 Gross per 24 hour  Intake 677 ml  Output 1400 ml  Net -723 ml   Net IO Since Admission: -1,090.02 mL [01/18/22 1038]  Wt Readings from Last 3 Encounters:  01/18/22 63.7 kg  09/16/21 61.8 kg  02/02/21 59.4 kg     Unresulted Labs (From admission, onward)     Start     Ordered   01/19/22 0500  CBC  Daily,   R      01/17/22 1053   01/18/22 3832  Basic metabolic panel  Daily,   R      01/17/22 1053          Data Reviewed: I have personally reviewed following labs and imaging studies CBC: Recent Labs  Lab 01/15/22 1916 01/16/22 0423  WBC 11.1* 9.8  NEUTROABS 9.5*  --   HGB 12.5 11.6*  HCT 38.5 36.4  MCV 98.2 97.8  PLT 188 919   Basic Metabolic Panel: Recent Labs  Lab 01/15/22 1916 01/16/22 0423 01/18/22 0331  NA 139 136 141  K 4.5 3.4* 3.9  CL 106 106 106  CO2 _0 GLUCOSE 120* 102* 113*  BUN _1 CREATININE 0.56 0.57 0.58  CALCIUM 9.3 8.6* 8.8*   GFR: Estimated Creatinine  Clearance: 52.4 mL/min (by C-G formula based on SCr of 0.58 mg/dL). Liver Function Tests: No results for input(s): "AST", "ALT", "ALKPHOS", "BILITOT", "PROT", "ALBUMIN" in the last 168 hours. No results for input(s): "LIPASE", "AMYLASE" in the last 168 hours. No results for input(s): "AMMONIA" in the last 168 hours. Coagulation Profile: Recent Labs  Lab 01/15/22 1916  INR 1.0   BNP (last 3 results) No results for input(s): "PROBNP" in the last 8760 hours. HbA1C: No results for input(s): "HGBA1C" in the last 72 hours. CBG: No results for input(s): "GLUCAP" in the last 168 hours.  Lipid Profile: No results for input(s): "CHOL", "HDL", "LDLCALC", "TRIG", "CHOLHDL", "LDLDIRECT" in the last 72 hours. Thyroid Function Tests: No results for input(s): "TSH", "T4TOTAL", "FREET4", "T3FREE", "THYROIDAB" in the last 72 hours. Sepsis Labs: No results for input(s): "PROCALCITON", "LATICACIDVEN" in the last 168 hours.  Recent Results (from the past 240 hour(s))  Surgical pcr screen     Status: Abnormal   Collection Time: 01/17/22  5:17 PM   Specimen: Nasal Mucosa; Nasal Swab  Result Value Ref Range Status   MRSA, PCR NEGATIVE NEGATIVE Final   Staphylococcus aureus POSITIVE (A) NEGATIVE Final    Comment: (NOTE) The Xpert SA Assay (FDA approved for NASAL specimens in patients 22 years of age and older), is one component of a comprehensive surveillance program. It is not intended to diagnose infection nor to guide or monitor treatment. Performed at [facility], [address] [place], [place] [address]     Antimicrobials: Anti-infectives (From admission, onward)    None      Culture/Microbiology No results found for: "SDES", "SPECREQUEST", "CULT", "REPTSTATUS"  Other culture-see note  Radiology Studies: No results found.   LOS: 3 days   Ramesh KC, MD [facility]  01/18/2022, 10:38 AM    

## 2022-01-18 NOTE — Op Note (Signed)
OPERATIVE REPORT  SURGEON: Samson Frederic, MD   ASSISTANT: Clint Bolder, PA-C  PREOPERATIVE DIAGNOSIS: Displaced Left femoral neck fracture.   POSTOPERATIVE DIAGNOSIS: Displaced Left femoral neck fracture.   PROCEDURE: Left total hip arthroplasty, anterior approach.   IMPLANTS: Biomet Taperloc Reduced Distal stem, size 14 x , high offset. Biomet G7 OsseoTi Cup, size 52 mm. Biomet Vivacit-E liner, size 36 mm, E, neutral. Biomet metal head ball, size 36 - 3 mm.  ANESTHESIA:  Spinal  ANTIBIOTICS: 2g ancef.  ESTIMATED BLOOD LOSS:-300 mL    DRAINS: None.  COMPLICATIONS: None   CONDITION: PACU - hemodynamically stable.   BRIEF CLINICAL NOTE: Destiny Hoffman is a 76 y.o. female with a displaced Left femoral neck fracture. The patient was admitted to the hospitalist service and underwent perioperative risk stratification and medical optimization. The risks, benefits, and alternatives to total hip arthroplasty were explained, and the patient elected to proceed.  PROCEDURE IN DETAIL: The patient was taken to the operating room and general anesthesia was induced on the hospital bed.  The patient was then positioned on the Hana table.  All bony prominences were well padded.  The hip was prepped and draped in the normal sterile surgical fashion.  A time-out was called verifying side and site of surgery. Antibiotics were given within 60 minutes of beginning the procedure.   Bikini incision was made, and the direct anterior approach to the hip was performed through the Hueter interval.  Lateral femoral circumflex vessels were treated with the Auqumantys. The anterior capsule was exposed and an inverted T capsulotomy was made.  Fracture hematoma was encountered and evacuated. The patient was found to have a comminuted Left subcapital femoral neck fracture.  I freshened the femoral neck cut with a saw.  I removed the femoral neck fragment.  A corkscrew was placed into the head and the head was  removed.  This was passed to the back table and was measured. The pubofemoral ligament was released subperiosteally to the lesser trochanter.  Acetabular exposure was achieved, and the pulvinar and labrum were excised. Sequential reaming of the acetabulum was then performed up to a size 51 mm reamer under direct visulization. A 52 mm cup was then opened and impacted into place at approximately 40 degrees of abduction and 20 degrees of anteversion. The final polyethylene liner was impacted into place and acetabular osteophytes were removed.    I then gained femoral exposure taking care to protect the abductors and greater trochanter.  This was performed using standard external rotation, extension, and adduction.  A cookie cutter was used to enter the femoral canal, and then the femoral canal finder was placed.  Sequential broaching was performed up to a size 14.  Calcar planer was used on the femoral neck remnant.  I placed a high offset neck and a trial head ball.  The hip was reduced.  Leg lengths and offset were checked fluoroscopically.  The hip was dislocated and trial components were removed.  The final implants were placed, and the hip was reduced.  Fluoroscopy was used to confirm component position and leg lengths.  At 90 degrees of external rotation and full extension, the hip was stable to an anterior directed force.   The wound was copiously irrigated with Irrisept solution and normal saline using pule lavage.  Marcaine solution was injected into the periarticular soft tissue.  The wound was closed in layers using #1 Stratafix for the fascia, 2-0 Vicryl for the subcutaneous fat, 2-0 Monocryl for  the deep dermal layer, 3-0 running Monocryl subcuticular stitch, and Dermabond for the skin.  Once the glue was fully dried, an Aquacell Ag dressing was applied.  The patient was transported to the recovery room in stable condition.  Sponge, needle, and instrument counts were correct at the end of the case  x2.  The patient tolerated the procedure well and there were no known complications.  Please note that a surgical assistant was a medical necessity for this procedure to perform it in a safe and expeditious manner. Assistant was necessary to provide appropriate retraction of vital neurovascular structures, to prevent femoral fracture, and to allow for anatomic placement of the prosthesis.

## 2022-01-18 NOTE — Discharge Instructions (Signed)
? ?Dr. Maira Christon ?Joint Replacement Specialist ?[facility] ?[address] ?[place], [place] [address] ?[phone] ? ? ?TOTAL HIP REPLACEMENT POSTOPERATIVE DIRECTIONS ? ? ? ?Hip Rehabilitation, Guidelines Following Surgery  ? ?WEIGHT BEARING ?Weight bearing as tolerated with assist device (walker, cane, etc) as directed, use it as long as suggested by your surgeon or therapist, typically at least 4-6 weeks. ? ?The results of a hip operation are greatly improved after range of motion and muscle strengthening exercises. Follow all safety measures which are given to protect your hip. If any of these exercises cause increased pain or swelling in your joint, decrease the amount until you are comfortable again. Then slowly increase the exercises. Call your caregiver if you have problems or questions.  ? ?HOME CARE INSTRUCTIONS  ?Most of the following instructions are designed to prevent the dislocation of your new hip.  ?Remove items at home which could result in a fall. This includes throw rugs or furniture in walking pathways.  ?Continue medications as instructed at time of discharge. ?You may have some home medications which will be placed on hold until you complete the course of blood thinner medication. ?You may start showering once you are discharged home. Do not remove your dressing. ?Do not put on socks or shoes without following the instructions of your caregivers.   ?Sit on chairs with arms. Use the chair arms to help push yourself up when arising.  ?Arrange for the use of a toilet seat elevator so you are not sitting low.  ?Walk with walker as instructed.  ?You may resume a sexual relationship in one month or when given the OK by your caregiver.  ?Use walker as long as suggested by your caregivers.  ?You may put full weight on your legs and walk as much as is comfortable. ?Avoid periods of inactivity such as sitting longer than an hour when not asleep. This helps prevent blood  clots.  ?You may return to work once you are cleared by your surgeon.  ?Do not drive a car for 6 weeks or until released by your surgeon.  ?Do not drive while taking narcotics.  ?Wear elastic stockings for two weeks following surgery during the day but you may remove then at night.  ?Make sure you keep all of your appointments after your operation with all of your doctors and caregivers. You should call the office at the above phone number and make an appointment for approximately two weeks after the date of your surgery. ?Please pick up a stool softener and laxative for home use as long as you are requiring pain medications. ?ICE to the affected hip every three hours for 30 minutes at a time and then as needed for pain and swelling. Continue to use ice on the hip for pain and swelling from surgery. You may notice swelling that will progress down to the foot and ankle.  This is normal after surgery.  Elevate the leg when you are not up walking on it.   ?It is important for you to complete the blood thinner medication as prescribed by your doctor. ?Continue to use the breathing machine which will help keep your temperature down.  It is common for your temperature to cycle up and down following surgery, especially at night when you are not up moving around and exerting yourself.  The breathing machine keeps your lungs expanded and your temperature down. ? ?RANGE OF MOTION AND STRENGTHENING EXERCISES  ?These exercises are designed to help you   keep full movement of your hip joint. Follow your caregiver's or physical therapist's instructions. Perform all exercises about fifteen times, three times per day or as directed. Exercise both hips, even if you have had only one joint replacement. These exercises can be done on a training (exercise) mat, on the floor, on a table or on a bed. Use whatever works the best and is most comfortable for you. Use music or television while you are exercising so that the exercises are a  pleasant break in your day. This will make your life better with the exercises acting as a break in routine you can look forward to.  ?Lying on your back, slowly slide your foot toward your buttocks, raising your knee up off the floor. Then slowly slide your foot back down until your leg is straight again.  ?Lying on your back spread your legs as far apart as you can without causing discomfort.  ?Lying on your side, raise your upper leg and foot straight up from the floor as far as is comfortable. Slowly lower the leg and repeat.  ?Lying on your back, tighten up the muscle in the front of your thigh (quadriceps muscles). You can do this by keeping your leg straight and trying to raise your heel off the floor. This helps strengthen the largest muscle supporting your knee.  ?Lying on your back, tighten up the muscles of your buttocks both with the legs straight and with the knee bent at a comfortable angle while keeping your heel on the floor.  ? ?SKILLED REHAB INSTRUCTIONS: ?If the patient is transferred to a skilled rehab facility following release from the hospital, a list of the current medications will be sent to the facility for the patient to continue.  When discharged from the skilled rehab facility, please have the facility set up the patient's Home Health Physical Therapy prior to being released. Also, the skilled facility will be responsible for providing the patient with their medications at time of release from the facility to include their pain medication and their blood thinner medication. If the patient is still at the rehab facility at time of the two week follow up appointment, the skilled rehab facility will also need to assist the patient in arranging follow up appointment in our office and any transportation needs. ? ?POST-OPERATIVE OPIOID TAPER INSTRUCTIONS: ?It is important to wean off of your opioid medication as soon as possible. If you do not need pain medication after your surgery it is ok  to stop day one. ?Opioids include: ?Codeine, Hydrocodone(Norco, Vicodin), Oxycodone(Percocet, oxycontin) and hydromorphone amongst others.  ?Long term and even short term use of opiods can cause: ?Increased pain response ?Dependence ?Constipation ?Depression ?Respiratory depression ?And more.  ?Withdrawal symptoms can include ?Flu like symptoms ?Nausea, vomiting ?And more ?Techniques to manage these symptoms ?Hydrate well ?Eat regular healthy meals ?Stay active ?Use relaxation techniques(deep breathing, meditating, yoga) ?Do Not substitute Alcohol to help with tapering ?If you have been on opioids for less than two weeks and do not have pain than it is ok to stop all together.  ?Plan to wean off of opioids ?This plan should start within one week post op of your joint replacement. ?Maintain the same interval or time between taking each dose and first decrease the dose.  ?Cut the total daily intake of opioids by one tablet each day ?Next start to increase the time between doses. ?The last dose that should be eliminated is the evening dose.  ? ? ?MAKE   SURE YOU:  ?Understand these instructions.  ?Will watch your condition.  ?Will get help right away if you are not doing well or get worse. ? ?Pick up stool softner and laxative for home use following surgery while on pain medications. ?Do not remove your dressing. ?The dressing is waterproof--it is OK to take showers. ?Continue to use ice for pain and swelling after surgery. ?Do not use any lotions or creams on the incision until instructed by your surgeon. ?Total Hip Protocol. ? ?

## 2022-01-18 NOTE — Anesthesia Postprocedure Evaluation (Signed)
Anesthesia Post Note  Patient: Destiny Hoffman  Procedure(s) Performed: TOTAL HIP ARTHROPLASTY ANTERIOR APPROACH (Left: Hip)     Patient location during evaluation: PACU Anesthesia Type: Spinal Level of consciousness: awake and alert Pain management: pain level controlled Vital Signs Assessment: post-procedure vital signs reviewed and stable Respiratory status: spontaneous breathing, nonlabored ventilation and respiratory function stable Cardiovascular status: blood pressure returned to baseline Postop Assessment: no apparent nausea or vomiting, spinal receding, no headache and no backache Anesthetic complications: no   No notable events documented.  Last Vitals:  Vitals:   01/18/22 1745 01/18/22 1801  BP: (!) 149/81 136/79  Pulse: 78 80  Resp: 16 17  Temp: 37 C 37.1 C  SpO2: 95% 98%    Last Pain:  Vitals:   01/18/22 1801  TempSrc: Oral  PainSc: 8                  Shanda Howells

## 2022-01-18 NOTE — Progress Notes (Signed)
24 hour chart audit completed 

## 2022-01-18 NOTE — TOC Initial Note (Signed)
Transition of Care [facility]) - Initial/Assessment Note    Patient Details  Name: Destiny Hoffman MRN: [id number] Date of Birth: May 27, 1946  [facility] CM/SW Contact:    Lennart Pall, LCSW Phone Number: 01/18/2022, 1:44 PM  Clinical Narrative:                 Met with pt and have spoken with spouse today to introduce self/ TOC role with dc planning needs.  Both await planned surgery this afternoon and are anticipating need for SNF rehab following.  Spouse has already spoken with [facility] in [place] and this is their preferred facility.  Explained to both the need to secure insurance authorization once therapy has seen her here.  Both understand and agreeable.  Will continue to follow.  Expected Discharge Plan: Skilled Nursing Facility Barriers to Discharge: Continued Medical Work up, Ship broker   Patient Goals and CMS Choice Patient states their goals for this hospitalization and ongoing recovery are:: eventually return home following SNF rehab      Expected Discharge Plan and Services Expected Discharge Plan: [place] In-house Referral: Clinical Social Work   Post Acute Care Choice: [place] Living arrangements for the past 2 months: Single Family Home                 DME Arranged: N/A DME Agency: NA                  Prior Living Arrangements/Services Living arrangements for the past 2 months: Single Family Home Lives with:: Spouse Patient language and need for interpreter reviewed:: Yes Do you feel safe going back to the place where you live?: Yes      Need for Family Participation in Patient Care: Yes (Comment) Care giver support system in place?: Yes (comment)   Criminal Activity/Legal Involvement Pertinent to Current Situation/Hospitalization: No - Comment as needed  Activities of Daily Living Home Assistive Devices/Equipment: None ADL Screening (condition at time of admission) Patient's  cognitive ability adequate to safely complete daily activities?: Yes Is the patient deaf or have difficulty hearing?: No Does the patient have difficulty seeing, even when wearing glasses/contacts?: No Does the patient have difficulty concentrating, remembering, or making decisions?: No Patient able to express need for assistance with ADLs?: Yes Does the patient have difficulty dressing or bathing?: No Independently performs ADLs?: Yes (appropriate for developmental age) Does the patient have difficulty walking or climbing stairs?: Yes Weakness of Legs: Left Weakness of Arms/Hands: None  Permission Sought/Granted Permission sought to share information with : Family Supports, Chartered certified accountant granted to share information with : Yes, Verbal Permission Granted  Share Information with NAME: Destiny Hoffman     Permission granted to share info w Relationship: spouse  Permission granted to share info w Contact Information: [phone]  Emotional Assessment Appearance:: Appears stated age Attitude/Demeanor/Rapport: Engaged, Gracious Affect (typically observed): Accepting Orientation: : Oriented to Self, Oriented to Place, Oriented to  Time, Oriented to Situation Alcohol / Substance Use: Not Applicable Psych Involvement: No (comment)  Admission diagnosis:  Fracture of femoral neck, left, closed ([place]) [S72.002A] Fall, initial encounter [W19.XXXA] Closed fracture of neck of left femur, initial encounter ([place]) [S72.002A] Patient Active Problem List   Diagnosis Date Noted   Malnutrition of moderate degree 01/18/2022   Hypokalemia 01/16/2022   Fracture of femoral neck, left, closed ([place]) 01/15/2022   Seizure ([place]) 01/15/2022   Fall at home, initial encounter 01/15/2022   Chronic combined systolic  and diastolic CHF (congestive heart failure) ([place]) 01/15/2022   Essential hypertension 01/15/2022   History of hyperkalemia    Cardiomyopathy, ischemic 11/01/2012   Mixed  hyperlipidemia 03/07/2010   Coronary atherosclerosis of native coronary artery 03/07/2010   PCP:  Jani Gravel, MD Pharmacy:   [facility] [address], [place] [address] [place] [address] Phone: [phone] Fax: [phone]     Social Determinants of Health (SDOH) Interventions    Readmission Risk Interventions    01/18/2022    1:42 PM  Readmission Risk Prevention Plan  Post Dischage Appt Complete  Medication Screening Complete  Transportation Screening Complete

## 2022-01-19 ENCOUNTER — Encounter (HOSPITAL_COMMUNITY): Payer: Self-pay | Admitting: Orthopedic Surgery

## 2022-01-19 DIAGNOSIS — I2510 Atherosclerotic heart disease of native coronary artery without angina pectoris: Secondary | ICD-10-CM | POA: Diagnosis not present

## 2022-01-19 DIAGNOSIS — I5042 Chronic combined systolic (congestive) and diastolic (congestive) heart failure: Secondary | ICD-10-CM | POA: Diagnosis not present

## 2022-01-19 DIAGNOSIS — E440 Moderate protein-calorie malnutrition: Secondary | ICD-10-CM

## 2022-01-19 DIAGNOSIS — S72002A Fracture of unspecified part of neck of left femur, initial encounter for closed fracture: Secondary | ICD-10-CM | POA: Diagnosis not present

## 2022-01-19 DIAGNOSIS — W19XXXA Unspecified fall, initial encounter: Secondary | ICD-10-CM | POA: Diagnosis not present

## 2022-01-19 LAB — CBC
HCT: 30.5 % — ABNORMAL LOW (ref 36.0–46.0)
Hemoglobin: 10 g/dL — ABNORMAL LOW (ref 12.0–15.0)
MCH: 31.7 pg (ref 26.0–34.0)
MCHC: 32.8 g/dL (ref 30.0–36.0)
MCV: 96.8 fL (ref 80.0–100.0)
Platelets: 170 10*3/uL (ref 150–400)
RBC: 3.15 MIL/uL — ABNORMAL LOW (ref 3.87–5.11)
RDW: 12.2 % (ref 11.5–15.5)
WBC: 11.4 10*3/uL — ABNORMAL HIGH (ref 4.0–10.5)
nRBC: 0 % (ref 0.0–0.2)

## 2022-01-19 LAB — BASIC METABOLIC PANEL
Anion gap: 7 (ref 5–15)
BUN: 12 mg/dL (ref 8–23)
CO2: 23 mmol/L (ref 22–32)
Calcium: 8.3 mg/dL — ABNORMAL LOW (ref 8.9–10.3)
Chloride: 108 mmol/L (ref 98–111)
Creatinine, Ser: 0.56 mg/dL (ref 0.44–1.00)
GFR, Estimated: >60 mL/min (ref >60)
Glucose, Bld: 123 mg/dL — ABNORMAL HIGH (ref 70–99)
Potassium: 4 mmol/L (ref 3.5–5.1)
Sodium: 138 mmol/L (ref 135–145)

## 2022-01-19 MED ORDER — ENOXAPARIN SODIUM 40 MG/0.4ML IJ SOSY
40.0000 mg | PREFILLED_SYRINGE | INTRAMUSCULAR | Status: DC
  Administered 2022-01-19: 40 mg via SUBCUTANEOUS
  Filled 2022-01-19: qty 0.4

## 2022-01-19 MED ORDER — HYDROCODONE-ACETAMINOPHEN 10-325 MG PO TABS: 0.5000 | ORAL_TABLET | ORAL | 0 refills | Status: AC | PRN

## 2022-01-19 MED ORDER — ASPIRIN 81 MG PO CHEW: 81.0000 mg | CHEWABLE_TABLET | Freq: Two times a day (BID) | ORAL | 0 refills | Status: AC

## 2022-01-19 NOTE — Progress Notes (Signed)
PROGRESS NOTE    Destiny Hoffman  MIW:[id number] DOB: 11/25/1945 DOA: 01/15/2022 PCP: Jani Gravel, MD   Brief Narrative:  30-yof w/ CAD-MI s/p BMS LAD 2011 w/ small RCA 80% (med rx), HFrEF, hypertension, hyperlipidemia, seizure disorder presenting after mechanical fall-patient tripped over her cot at home landing on her left side/in ED- CT of the left hip showed left femoral neck fracture with mild impaction.  Orthopedics, Dr. Alfonse Spruce was consulted and requested transfer to [facility].labd done- EKG showed normal sinus rhythm.  There is no ST-T wave changes. Transferred to [facility] per Dr. Alvan Dame.   Assessment & Plan:   Principal Problem:   Fracture of femoral neck, left, closed ([place]) Active Problems:   Mixed hyperlipidemia   Coronary atherosclerosis of native coronary artery   Seizure ([place])   Fall at home, initial encounter   Chronic combined systolic and diastolic CHF (congestive heart failure) (HCC)   Essential hypertension   Hypokalemia   Malnutrition of moderate degree   Fracture of femoral neck, left, closed Fall at home: Appears to be in moderate risk given patient's CAD, age, CHF-which appears compensated, w. MET >4-cardio preop eval appreciated.  Ortho consulted for operative intervention 7/5.  Was on DVT prophylaxis with SCD started chemical prophylaxis 24 hours postop per orthopedics.   CAD s/p BMS LAD 2011 w/ small RCA 80% (med rx) Chronic combined systolic and diastolic CHF Ischemic cardiomyopathy: No chest pain, CHF is compensated.Last echo from August 2022 with EF 30-25%.  Not on diuretics Continue aspirin Coreg and Crestor.  Appreciate cardiology inputs.   HTN HLD: BP well controlled, continue Coreg and Crestor.     Seizure on phenobarbital. Hypokalemia: Resolved  Mild leukocytosis likely reactive from part.  Resolved.  Monitor  DVT prophylaxis: Lovenox SQ  Code Status: full    Code Status Orders  (From admission, onward)           Start     Ordered    01/15/22 2111  Full code  Continuous        01/15/22 2111           Code Status History     This patient has a current code status but no historical code status.      Family Communication: discussed with patient  Disposition Plan:   likely 1 day Consults called: None Admission status: Inpatient   Consultants:  ortho , cards  Procedures:  DG Pelvis Portable  Result Date: 01/18/2022 CLINICAL DATA:  Status post left hip arthroplasty. EXAM: PORTABLE PELVIS 1-2 VIEWS COMPARISON:  Preoperative imaging 01/15/2022 FINDINGS: Left hip arthroplasty in expected alignment. No periprosthetic lucency or fracture. Recent postsurgical change includes air and edema in the soft tissues. Lateral skin staples in place. IMPRESSION: Left hip arthroplasty without immediate postoperative complication. Electronically Signed   By: Keith Rake M.D.   On: 01/18/2022 17:06   DG HIP UNILAT WITH PELVIS 1V LEFT  Result Date: 01/18/2022 CLINICAL DATA:  Left hip replacement EXAM: DG HIP (WITH OR WITHOUT PELVIS) 1V*L* COMPARISON:  01/15/2022 FINDINGS: Left hip replacement in satisfactory position and alignment. No fracture or complication IMPRESSION: Satisfactory left hip replacement Electronically Signed   By: Franchot Gallo M.D.   On: 01/18/2022 16:34   DG C-Arm 1-60 Min-No Report  Result Date: 01/18/2022 Fluoroscopy was utilized by the requesting physician.  No radiographic interpretation.   CT HIP LEFT WO CONTRAST  Result Date: 01/15/2022 CLINICAL DATA:  Left hip fracture EXAM: CT OF THE LEFT HIP WITHOUT CONTRAST  TECHNIQUE: Multidetector CT imaging of the left hip was performed according to the standard protocol. Multiplanar CT image reconstructions were also generated. RADIATION DOSE REDUCTION: This exam was performed according to the departmental dose-optimization program which includes automated exposure control, adjustment of the mA and/or kV according to patient size and/or use of iterative  reconstruction technique. COMPARISON:  Same-day x-ray FINDINGS: Bones/Joint/Cartilage Acute transcervical left femoral neck fracture with mild impaction. No significant displacement or angulation. Fracture extends to the lateral most margin of the femoral head which may involve the articular surface (series 6, image 59). No fracture involvement of the intertrochanteric region. Hip joint alignment is maintained without dislocation. Joint space is relatively preserved with mild marginal osteophyte formation. Moderate-sized hip joint effusion. Included portion of the left hemipelvis is intact. No additional fractures are seen. No pelvic diastasis. Ligaments Suboptimally assessed by CT. Muscles and Tendons No acute musculotendinous abnormality by CT. Soft tissues No hematoma within the soft tissues. No left inguinal lymphadenopathy. Diverticular changes of the sigmoid colon. No acute findings within the left hemipelvis. IMPRESSION: 1. Acute transcervical left femoral neck fracture with mild impaction. Fracture extends to the lateral most margin of the femoral head which may involve the articular surface. 2. Moderate-sized hip joint effusion. Electronically Signed   By: Davina Poke D.O.   On: 01/15/2022 21:36   DG Hip Unilat W or Wo Pelvis 2-3 Views Left  Result Date: 01/15/2022 CLINICAL DATA:  Left hip pain.  Fall EXAM: DG HIP (WITH OR WITHOUT PELVIS) 2-3V LEFT COMPARISON:  None Available. FINDINGS: Acute minimally impacted left femoral neck fracture. No significant displacement or angulation. Bones are demineralized. No dislocation. IMPRESSION: Acute minimally impacted left femoral neck fracture. Electronically Signed   By: Davina Poke D.O.   On: 01/15/2022 18:45   DG Knee Complete 4 Views Left  Result Date: 01/15/2022 CLINICAL DATA:  left hip pain EXAM: LEFT KNEE - COMPLETE 4+ VIEW COMPARISON:  None Available. FINDINGS: Decreased bone density. No evidence of fracture, dislocation, or joint effusion. No  evidence of arthropathy or other focal bone abnormality. Soft tissues are unremarkable. IMPRESSION: No acute displaced fracture or dislocation. Electronically Signed   By: Iven Finn M.D.   On: 01/15/2022 18:45       Subjective: Doing well postop, no complaints  Objective: Vitals:   01/19/22 0154 01/19/22 0502 01/19/22 1000 01/19/22 1302  BP: 118/80 (!) 143/74 118/76 (!) 110/57  Pulse: 72 80 82 95  Resp: '18 18 18 18  ' Temp: 98.4 F (36.9 C) 98.9 F (37.2 C) 98 F (36.7 C) 98.5 F (36.9 C)  TempSrc: Oral Oral Oral Oral  SpO2: 90% 94% 98% 94%  Weight:      Height:        Intake/Output Summary (Last 24 hours) at 01/19/2022 1413 Last data filed at 01/19/2022 1000 Gross per 24 hour  Intake 3329.25 ml  Output 1500 ml  Net 1829.25 ml   Filed Weights   01/17/22 0003 01/18/22 0535 01/18/22 1425  Weight: 61.7 kg 63.7 kg 63.7 kg    Examination:  General exam: Appears calm and comfortable  Respiratory system: Clear to auscultation. Respiratory effort normal. Cardiovascular system: S1 & S2 heard, RRR. No JVD, murmurs, rubs, gallops or clicks. No pedal edema. Gastrointestinal system: Abdomen is nondistended, soft and nontender. No organomegaly or masses felt. Normal bowel sounds heard. Central nervous system: Alert and oriented. No focal neurological deficits. Extremities: wwp, nv intact on affected surg extremity Skin: No rashes, lesions or ulcers Psychiatry:  Judgement and insight appear normal. Mood & affect appropriate.     Data Reviewed: I have personally reviewed following labs and imaging studies  CBC: Recent Labs  Lab 01/15/22 1916 01/16/22 0423 01/19/22 0334  WBC 11.1* 9.8 11.4*  NEUTROABS 9.5*  --   --   HGB 12.5 11.6* 10.0*  HCT 38.5 36.4 30.5*  MCV 98.2 97.8 96.8  PLT 188 183 711   Basic Metabolic Panel: Recent Labs  Lab 01/15/22 1916 01/16/22 0423 01/18/22 0331 01/19/22 0334  NA 139 136 141 138  K 4.5 3.4* 3.9 4.0  CL 106 106 106 108  CO2 '26 22  28 23  ' GLUCOSE 120* 102* 113* 123*  BUN '11 11 15 12  ' CREATININE 0.56 0.57 0.58 0.56  CALCIUM 9.3 8.6* 8.8* 8.3*   GFR: Estimated Creatinine Clearance: 52.4 mL/min (by C-G formula based on SCr of 0.56 mg/dL). Liver Function Tests: No results for input(s): "AST", "ALT", "ALKPHOS", "BILITOT", "PROT", "ALBUMIN" in the last 168 hours. No results for input(s): "LIPASE", "AMYLASE" in the last 168 hours. No results for input(s): "AMMONIA" in the last 168 hours. Coagulation Profile: Recent Labs  Lab 01/15/22 1916  INR 1.0   Cardiac Enzymes: No results for input(s): "CKTOTAL", "CKMB", "CKMBINDEX", "TROPONINI" in the last 168 hours. BNP (last 3 results) No results for input(s): "PROBNP" in the last 8760 hours. HbA1C: No results for input(s): "HGBA1C" in the last 72 hours. CBG: No results for input(s): "GLUCAP" in the last 168 hours. Lipid Profile: No results for input(s): "CHOL", "HDL", "LDLCALC", "TRIG", "CHOLHDL", "LDLDIRECT" in the last 72 hours. Thyroid Function Tests: No results for input(s): "TSH", "T4TOTAL", "FREET4", "T3FREE", "THYROIDAB" in the last 72 hours. Anemia Panel: No results for input(s): "VITAMINB12", "FOLATE", "FERRITIN", "TIBC", "IRON", "RETICCTPCT" in the last 72 hours. Sepsis Labs: No results for input(s): "PROCALCITON", "LATICACIDVEN" in the last 168 hours.  Recent Results (from the past 240 hour(s))  Surgical pcr screen     Status: Abnormal   Collection Time: 01/17/22  5:17 PM   Specimen: Nasal Mucosa; Nasal Swab  Result Value Ref Range Status   MRSA, PCR NEGATIVE NEGATIVE Final   Staphylococcus aureus POSITIVE (A) NEGATIVE Final    Comment: (NOTE) The Xpert SA Assay (FDA approved for NASAL specimens in patients 20 years of age and older), is one component of a comprehensive surveillance program. It is not intended to diagnose infection nor to guide or monitor treatment. Performed at [facility], [place] [address] [place], [place]  [address]          Radiology Studies: DG Pelvis Portable  Result Date: 01/18/2022 CLINICAL DATA:  Status post left hip arthroplasty. EXAM: PORTABLE PELVIS 1-2 VIEWS COMPARISON:  Preoperative imaging 01/15/2022 FINDINGS: Left hip arthroplasty in expected alignment. No periprosthetic lucency or fracture. Recent postsurgical change includes air and edema in the soft tissues. Lateral skin staples in place. IMPRESSION: Left hip arthroplasty without immediate postoperative complication. Electronically Signed   By: Keith Rake M.D.   On: 01/18/2022 17:06   DG HIP UNILAT WITH PELVIS 1V LEFT  Result Date: 01/18/2022 CLINICAL DATA:  Left hip replacement EXAM: DG HIP (WITH OR WITHOUT PELVIS) 1V*L* COMPARISON:  01/15/2022 FINDINGS: Left hip replacement in satisfactory position and alignment. No fracture or complication IMPRESSION: Satisfactory left hip replacement Electronically Signed   By: Franchot Gallo M.D.   On: 01/18/2022 16:34   DG C-Arm 1-60 Min-No Report  Result Date: 01/18/2022 Fluoroscopy was utilized by the requesting physician.  No radiographic  interpretation.        Scheduled Meds:  aspirin  81 mg Oral BID   carvedilol  6.25 mg Oral BID   Chlorhexidine Gluconate Cloth  6 each Topical Daily   docusate sodium  100 mg Oral BID   feeding supplement  237 mL Oral BID BM   multivitamin with minerals  1 tablet Oral Daily   mupirocin ointment  1 Application Nasal BID   omega-3 acid ethyl esters  1 g Oral Daily   PHENObarbital  30 mg Oral TID   rosuvastatin  20 mg Oral Daily   senna  1 tablet Oral BID   Continuous Infusions:  sodium chloride 150 mL/hr at 01/19/22 0618   methocarbamol (ROBAXIN) IV       LOS: 4 days    Time spent: 45 min    Nicolette Bang, MD [facility]  If 7PM-7AM, please contact night-coverage  01/19/2022, 2:13 PM

## 2022-01-19 NOTE — NC FL2 (Signed)
[place] MEDICAID FL2 LEVEL OF CARE SCREENING TOOL     IDENTIFICATION  Patient Name: Destiny Hoffman Birthdate: 1946/01/27 Sex: female Admission Date (Current Location): 01/15/2022  [facility] and [place] Number:  Producer, television/film/video and Address:  [facility],  [address] [place], [place] [address]      Provider Number: [id number]  Attending Physician Name and Address:  Marzetta Board*  Relative Name and Phone Number:  spouse, Chenoa Luddy [phone]    Current Level of Care: Hospital Recommended Level of Care: Skilled Nursing Facility Prior Approval Number:    Date Approved/Denied:   PASRR Number: [id number]  Discharge Plan: SNF    Current Diagnoses: Patient Active Problem List   Diagnosis Date Noted   Malnutrition of moderate degree 01/18/2022   Hypokalemia 01/16/2022   Fracture of femoral neck, left, closed (HCC) 01/15/2022   Seizure (HCC) 01/15/2022   Fall at home, initial encounter 01/15/2022   Chronic combined systolic and diastolic CHF (congestive heart failure) (HCC) 01/15/2022   Essential hypertension 01/15/2022   History of hyperkalemia    Cardiomyopathy, ischemic 11/01/2012   Mixed hyperlipidemia 03/07/2010   Coronary atherosclerosis of native coronary artery 03/07/2010    Orientation RESPIRATION BLADDER Height & Weight     Self, Time, Situation, Place  Normal Incontinent, External catheter (currently with purewick cath) Weight: 140 lb 6.9 oz (63.7 kg) Height:  5\' 2"  (157.5 cm)  BEHAVIORAL SYMPTOMS/MOOD NEUROLOGICAL BOWEL NUTRITION STATUS      Continent Diet  AMBULATORY STATUS COMMUNICATION OF NEEDS Skin   Limited Assist Verbally Other (Comment) (surgical incision only)                       Personal Care Assistance Level of Assistance  Bathing, Dressing Bathing Assistance: Limited assistance   Dressing Assistance: Limited assistance     Functional Limitations Info             SPECIAL CARE FACTORS FREQUENCY   OT (By licensed OT), PT (By licensed PT)     PT Frequency: 5x/wk OT Frequency: 5x/wk            Contractures Contractures Info: Not present    Additional Factors Info  Code Status, Allergies Code Status Info: Full Allergies Info: Ace Inhibitors           Current Medications (01/19/2022):  This is the current hospital active medication list Current Facility-Administered Medications  Medication Dose Route Frequency Provider Last Rate Last Admin   0.9 %  sodium chloride infusion   Intravenous Continuous 03/22/2022, MD 150 mL/hr at 01/19/22 0618 Infusion Verify at 01/19/22 0618   alum & mag hydroxide-simeth (MAALOX/MYLANTA) 200-200-20 MG/5ML suspension 30 mL  30 mL Oral Q4H PRN Swinteck, 01-23-2001, MD       aspirin chewable tablet 81 mg  81 mg Oral BID Arlys John, MD   81 mg at 01/19/22 1033   carvedilol (COREG) tablet 6.25 mg  6.25 mg Oral BID 03/22/22, MD   6.25 mg at 01/19/22 1033   Chlorhexidine Gluconate Cloth 2 % PADS 6 each  6 each Topical Daily Swinteck, 03/22/22, MD   6 each at 01/19/22 1033   diphenhydrAMINE (BENADRYL) 12.5 MG/5ML elixir 12.5-25 mg  12.5-25 mg Oral Q4H PRN Swinteck, 08-16-1984, MD       docusate sodium (COLACE) capsule 100 mg  100 mg Oral BID Arlys John, MD   100 mg at 01/19/22 1032   enoxaparin (LOVENOX) injection 40 mg  40  mg Subcutaneous Q24H Spongberg, Susy Frizzle, MD       feeding supplement (ENSURE ENLIVE / ENSURE PLUS) liquid 237 mL  237 mL Oral BID BM Swinteck, Arlys John, MD   237 mL at 01/19/22 1400   HYDROcodone-acetaminophen (NORCO/VICODIN) 5-325 MG per tablet 1-2 tablet  1-2 tablet Oral Q6H PRN Samson Frederic, MD   2 tablet at 01/19/22 0846   menthol-cetylpyridinium (CEPACOL) lozenge 3 mg  1 lozenge Oral PRN Samson Frederic, MD       Or   phenol (CHLORASEPTIC) mouth spray 1 spray  1 spray Mouth/Throat PRN Swinteck, Arlys John, MD       methocarbamol (ROBAXIN) tablet 500 mg  500 mg Oral Q6H PRN Samson Frederic, MD   500 mg at 01/19/22 7829    Or   methocarbamol (ROBAXIN) 500 mg in dextrose 5 % 50 mL IVPB  500 mg Intravenous Q6H PRN Swinteck, Arlys John, MD       metoCLOPramide (REGLAN) tablet 5-10 mg  5-10 mg Oral Q8H PRN Swinteck, Arlys John, MD       Or   metoCLOPramide (REGLAN) injection 5-10 mg  5-10 mg Intravenous Q8H PRN Swinteck, Arlys John, MD       morphine (PF) 2 MG/ML injection 0.5 mg  0.5 mg Intravenous Q2H PRN Samson Frederic, MD   0.5 mg at 01/19/22 5621   multivitamin with minerals tablet 1 tablet  1 tablet Oral Daily Swinteck, Arlys John, MD   1 tablet at 01/19/22 1031   mupirocin ointment (BACTROBAN) 2 % 1 Application  1 Application Nasal BID Samson Frederic, MD   1 Application at 01/19/22 1032   nitroGLYCERIN (NITROSTAT) SL tablet 0.4 mg  0.4 mg Sublingual Q5 Min x 3 PRN Swinteck, Arlys John, MD       omega-3 acid ethyl esters (LOVAZA) capsule 1 g  1 g Oral Daily Swinteck, Brian, MD   1 g at 01/19/22 1034   ondansetron (ZOFRAN) tablet 4 mg  4 mg Oral Q6H PRN Swinteck, Arlys John, MD       Or   ondansetron (ZOFRAN) injection 4 mg  4 mg Intravenous Q6H PRN Swinteck, Arlys John, MD       Oral care mouth rinse  15 mL Mouth Rinse PRN Swinteck, Arlys John, MD       PHENObarbital 20 MG/5ML elixir 30 mg  30 mg Oral TID Samson Frederic, MD   30 mg at 01/19/22 1033   polyethylene glycol (MIRALAX / GLYCOLAX) packet 17 g  17 g Oral Daily PRN Swinteck, Arlys John, MD       rosuvastatin (CRESTOR) tablet 20 mg  20 mg Oral Daily Swinteck, Arlys John, MD   20 mg at 01/19/22 1000   senna (SENOKOT) tablet 8.6 mg  1 tablet Oral BID Samson Frederic, MD   8.6 mg at 01/19/22 1032     Discharge Medications: Please see discharge summary for a list of discharge medications.  Relevant Imaging Results:  Relevant Lab Results:   Additional Information SS# [id number]  Amada Jupiter, LCSW

## 2022-01-19 NOTE — Progress Notes (Signed)
Physical Therapy Treatment Patient Details Name: Destiny Hoffman MRN: [id number] DOB: Nov 04, 1945 Today's Date: 01/19/2022   History of Present Illness Destiny Hoffman is a 76 y.o. female with medical history significant of hypertension, hyperlipidemia, seizure, CAD s/p stent placement and HFrEF who presents to the emergency department via EMS from home after sustaining a fall. Patient found to have acute minimally impacted left femoral neck fracture. Patient now s/p L THA via anterior approach.    PT Comments    Pt continues cooperative but limited this pm by fatigue and mild nausea.  Pt performed therex program with assist and up to ambulate limited distance in hall.   Recommendations for follow up therapy are one component of a multi-disciplinary discharge planning process, led by the attending physician.  Recommendations may be updated based on patient status, additional functional criteria and insurance authorization.  Follow Up Recommendations  Skilled nursing-short term rehab (<3 hours/day) Can patient physically be transported by private vehicle: Yes   Assistance Recommended at Discharge Frequent or constant Supervision/Assistance  Patient can return home with the following A little help with walking and/or transfers;A little help with bathing/dressing/bathroom;Assistance with cooking/housework;Assist for transportation;Help with stairs or ramp for entrance   Equipment Recommendations  None recommended by PT    Recommendations for Other Services       Precautions / Restrictions Precautions Precautions: Fall Restrictions Weight Bearing Restrictions: No RLE Weight Bearing: Weight bearing as tolerated LLE Weight Bearing: Weight bearing as tolerated     Mobility  Bed Mobility Overal bed mobility: Needs Assistance Bed Mobility: Supine to Sit     Supine to sit: Mod assist, HOB elevated     General bed mobility comments: cues for use of R LE to self assist and  sequence    Transfers Overall transfer level: Needs assistance Equipment used: Rolling walker (2 wheels) Transfers: Sit to/from Stand Sit to Stand: Min assist           General transfer comment: cues for LE management and use of UEs to self assist    Ambulation/Gait Ambulation/Gait assistance: Min assist Gait Distance (Feet): 22 Feet Assistive device: Rolling walker (2 wheels) Gait Pattern/deviations: Step-to pattern, Decreased step length - right, Decreased step length - left, Shuffle, Antalgic, Trunk flexed Gait velocity: decr     General Gait Details: Increaed time with cues for sequence, posture and position from [employer]             Wheelchair Mobility    Modified Rankin (Stroke Patients Only)       Balance Overall balance assessment: Needs assistance Sitting-balance support: No upper extremity supported, Feet supported Sitting balance-Leahy Scale: Good     Standing balance support: During functional activity, Reliant on assistive device for balance Standing balance-Leahy Scale: Fair                              Cognition Arousal/Alertness: Awake/alert Behavior During Therapy: WFL for tasks assessed/performed Overall Cognitive Status: Within Functional Limits for tasks assessed                                          Exercises Total Joint Exercises Ankle Circles/Pumps: AROM, Both, 15 reps, Supine Quad Sets: AROM, Both, 10 reps, Supine Heel Slides: AAROM, Left, 20 reps, Supine Hip ABduction/ADduction: AAROM, Left, 15 reps, Supine  General Comments        Pertinent Vitals/Pain Pain Assessment Pain Assessment: Faces Faces Pain Scale: Hurts little more Pain Location: L hip Pain Descriptors / Indicators: Grimacing Pain Intervention(s): Limited activity within patient's tolerance, Monitored during session (pt declines ice)    Home Living Family/patient expects to be discharged to:: Skilled nursing  facility Living Arrangements: Spouse/significant other                      Prior Function            PT Goals (current goals can now be found in the care plan section) Acute Rehab PT Goals Patient Stated Goal: Regain IND PT Goal Formulation: With patient Time For Goal Achievement: 01/26/22 Potential to Achieve Goals: Good Progress towards PT goals: Progressing toward goals    Frequency    Min 3X/week      PT Plan Current plan remains appropriate    Co-evaluation              AM-PAC PT "6 Clicks" Mobility   Outcome Measure  Help needed turning from your back to your side while in a flat bed without using bedrails?: A Lot Help needed moving from lying on your back to sitting on the side of a flat bed without using bedrails?: A Little Help needed moving to and from a bed to a chair (including a wheelchair)?: A Little Help needed standing up from a chair using your arms (e.g., wheelchair or bedside chair)?: A Little Help needed to walk in hospital room?: A Lot Help needed climbing 3-5 steps with a railing? : A Lot 6 Click Score: 15    End of Session Equipment Utilized During Treatment: Gait belt Activity Tolerance: Patient tolerated treatment well Patient left: in chair;with call bell/phone within reach;with chair alarm set Nurse Communication: Mobility status PT Visit Diagnosis: Difficulty in walking, not elsewhere classified (R26.2)     Time: 9371-6967 PT Time Calculation (min) (ACUTE ONLY): 27 min  Charges:  $Gait Training: 8-22 mins $Therapeutic Exercise: 8-22 mins                     Mauro Kaufmann PT Acute Rehabilitation Services Pager [phone] Office [phone]    Humberto Addo 01/19/2022, 3:42 PM

## 2022-01-19 NOTE — Evaluation (Signed)
Physical Therapy Evaluation Patient Details Name: Destiny Hoffman MRN: [id number] DOB: 1946/04/01 Today's Date: 01/19/2022  History of Present Illness  Destiny Hoffman is a 76 y.o. female with medical history significant of hypertension, hyperlipidemia, seizure, CAD s/p stent placement and HFrEF who presents to the emergency department via EMS from home after sustaining a fall. Patient found to have acute minimally impacted left femoral neck fracture. Patient now s/p L THA via anterior approach.  Clinical Impression  Destiny Hoffman is a 76 year old woman who lives at home with her husband and is typically independent. Patient s/p hip fracture repair with decreased ROM and strength of LLE, impaired balance, decreased activity tolerance and with complaints of pain resulting in a decline in functional mobility and ability to perform independent ADLs. Patient will benefit from skilled PT services while in hospital to maximize IND and safety in order to return to PLOF.  Recommend short term rehab at discharge     Recommendations for follow up therapy are one component of a multi-disciplinary discharge planning process, led by the attending physician.  Recommendations may be updated based on patient status, additional functional criteria and insurance authorization.  Follow Up Recommendations Skilled nursing-short term rehab (<3 hours/day) Can patient physically be transported by private vehicle: Yes    Assistance Recommended at Discharge Frequent or constant Supervision/Assistance  Patient can return home with the following  A little help with walking and/or transfers;A little help with bathing/dressing/bathroom;Assistance with cooking/housework;Assist for transportation;Help with stairs or ramp for entrance    Equipment Recommendations None recommended by PT  Recommendations for Other Services       Functional Status Assessment Patient has had a recent decline in their functional status  and demonstrates the ability to make significant improvements in function in a reasonable and predictable amount of time.     Precautions / Restrictions Precautions Precautions: Fall Restrictions Weight Bearing Restrictions: No RLE Weight Bearing: Weight bearing as tolerated LLE Weight Bearing: Weight bearing as tolerated      Mobility  Bed Mobility               General bed mobility comments: cues for use of R LE to self assist and sequence    Transfers Overall transfer level: Needs assistance   Transfers: Sit to/from Stand Sit to Stand: Min assist           General transfer comment: cues for LE management and use of UEs to self assist    Ambulation/Gait Ambulation/Gait assistance: Min assist Gait Distance (Feet): 44 Feet Assistive device: Rolling walker (2 wheels) Gait Pattern/deviations: Step-to pattern, Decreased step length - right, Decreased step length - left, Shuffle, Antalgic, Trunk flexed Gait velocity: decr     General Gait Details: Increaed time with cues for sequence, posture and position from [employer]            Wheelchair Mobility    Modified Rankin (Stroke Patients Only)       Balance                                             Pertinent Vitals/Pain Pain Assessment Pain Assessment: Faces Faces Pain Scale: Hurts a little bit Pain Location: L hip Pain Descriptors / Indicators: Grimacing Pain Intervention(s): Limited activity within patient's tolerance, Monitored during session, Premedicated before session    Home Living Family/patient expects  to be discharged to:: Skilled nursing facility Living Arrangements: Spouse/significant other                      Prior Function Prior Level of Function : Independent/Modified Independent             Mobility Comments: no device ADLs Comments: independent - help her husband and cooks     Hand Dominance   Dominant Hand: Right    Extremity/Trunk  Assessment   Upper Extremity Assessment Upper Extremity Assessment: Overall WFL for tasks assessed    Lower Extremity Assessment Lower Extremity Assessment: LLE deficits/detail    Cervical / Trunk Assessment Cervical / Trunk Assessment: Normal  Communication   Communication: No difficulties  Cognition Arousal/Alertness: Awake/alert Behavior During Therapy: WFL for tasks assessed/performed Overall Cognitive Status: Within Functional Limits for tasks assessed                                          General Comments      Exercises     Assessment/Plan    PT Assessment Patient needs continued PT services  PT Problem List Decreased strength;Decreased range of motion;Decreased activity tolerance;Decreased balance;Decreased mobility;Decreased knowledge of use of DME;Pain       PT Treatment Interventions DME instruction;Gait training;Stair training;Functional mobility training;Therapeutic activities;Therapeutic exercise;Patient/family education    PT Goals (Current goals can be found in the Care Plan section)  Acute Rehab PT Goals Patient Stated Goal: Regain IND PT Goal Formulation: With patient Time For Goal Achievement: 01/26/22 Potential to Achieve Goals: Good    Frequency Min 5X/week     Co-evaluation               AM-PAC PT "6 Clicks" Mobility  Outcome Measure Help needed turning from your back to your side while in a flat bed without using bedrails?: A Lot Help needed moving from lying on your back to sitting on the side of a flat bed without using bedrails?: A Little Help needed moving to and from a bed to a chair (including a wheelchair)?: A Little Help needed standing up from a chair using your arms (e.g., wheelchair or bedside chair)?: A Little Help needed to walk in hospital room?: A Lot Help needed climbing 3-5 steps with a railing? : A Lot 6 Click Score: 15    End of Session Equipment Utilized During Treatment: Gait belt Activity  Tolerance: Patient tolerated treatment well Patient left: in chair;with call bell/phone within reach;with chair alarm set Nurse Communication: Mobility status PT Visit Diagnosis: Difficulty in walking, not elsewhere classified (R26.2)    Time: 2951-8841 PT Time Calculation (min) (ACUTE ONLY): 33 min   Charges:   PT Evaluation $PT Eval Low Complexity: 1 Low          Mauro Kaufmann PT Acute Rehabilitation Services Pager [phone] Office [phone]   Ilona Colley 01/19/2022, 1:02 PM

## 2022-01-19 NOTE — Progress Notes (Signed)
    Subjective:  Patient reports pain as mild to moderate.  Denies N/V/CP/SOB/Abd pain.   Patient states she didn't sleep very well last night. She just wants to rest. She is stating that her pain is well controlled. She denies tingling and numbness in LE bilaterally. She states that she was able to ambulate with PT today and did okay.   Objective:   VITALS:   Vitals:   01/19/22 0154 01/19/22 0502 01/19/22 1000 01/19/22 1302  BP: 118/80 (!) 143/74 118/76 (!) 110/57  Pulse: 72 80 82 95  Resp: 18 18 18 18   Temp: 98.4 F (36.9 C) 98.9 F (37.2 C) 98 F (36.7 C) 98.5 F (36.9 C)  TempSrc: Oral Oral Oral Oral  SpO2: 90% 94% 98% 94%  Weight:      Height:        Patient is lying in bed sleeping. Woke her up for exam. NAD.  ABD soft Neurovascular intact Sensation intact distally Intact pulses distally Dorsiflexion/Plantar flexion intact Incision: dressing C/D/I No cellulitis present Compartment soft   Lab Results  Component Value Date   WBC 11.4 (H) 01/19/2022   HGB 10.0 (L) 01/19/2022   HCT 30.5 (L) 01/19/2022   MCV 96.8 01/19/2022   PLT 170 01/19/2022   BMET    Component Value Date/Time   NA 138 01/19/2022 0334   K 4.0 01/19/2022 0334   CL 108 01/19/2022 0334   CO2 23 01/19/2022 0334   GLUCOSE 123 (H) 01/19/2022 0334   BUN 12 01/19/2022 0334   CREATININE 0.56 01/19/2022 0334   CALCIUM 8.3 (L) 01/19/2022 0334   GFRNONAA >60 01/19/2022 0334     Assessment/Plan: 1 Day Post-Op   Principal Problem:   Fracture of femoral neck, left, closed (HCC) Active Problems:   Mixed hyperlipidemia   Coronary atherosclerosis of native coronary artery   Seizure (HCC)   Fall at home, initial encounter   Chronic combined systolic and diastolic CHF (congestive heart failure) (HCC)   Essential hypertension   Hypokalemia   Malnutrition of moderate degree   WBAT with walker DVT ppx: Aspirin, SCDs, TEDS PO pain control PT/OT: Patient ambulated with PT. Continue PT.   Dispo: Disposition per medical team. Likely d/c to SNF per notes. Pain medication and DVT ppx printed in chart.    03/22/2022, PA-C 01/19/2022, 3:36 PM  [facility]  [place] [address], [place], [place] Phone: [phone] [email or web address] Facebook  [phone]

## 2022-01-19 NOTE — Progress Notes (Signed)
24 hour chart audit completed 

## 2022-01-19 NOTE — Progress Notes (Addendum)
Progress Note  Patient Name: Destiny Hoffman Date of Encounter: 01/19/2022  [facility] Cardiologist: Nona Dell, MD   Subjective   Some pain L hip, some confusion, ? From pain meds. No CP/SOB.  Inpatient Medications    Scheduled Meds:  aspirin  81 mg Oral BID   carvedilol  6.25 mg Oral BID   Chlorhexidine Gluconate Cloth  6 each Topical Daily   docusate sodium  100 mg Oral BID   feeding supplement  237 mL Oral BID BM   multivitamin with minerals  1 tablet Oral Daily   mupirocin ointment  1 Application Nasal BID   omega-3 acid ethyl esters  1 g Oral Daily   PHENObarbital  30 mg Oral TID   rosuvastatin  20 mg Oral Daily   senna  1 tablet Oral BID   Continuous Infusions:  sodium chloride 150 mL/hr at 01/19/22 0618   methocarbamol (ROBAXIN) IV     PRN Meds: alum & mag hydroxide-simeth, diphenhydrAMINE, HYDROcodone-acetaminophen, menthol-cetylpyridinium **OR** phenol, methocarbamol **OR** methocarbamol (ROBAXIN) IV, metoCLOPramide **OR** metoCLOPramide (REGLAN) injection, morphine injection, nitroGLYCERIN, ondansetron **OR** ondansetron (ZOFRAN) IV, mouth rinse, polyethylene glycol   Vital Signs    Vitals:   01/18/22 1954 01/18/22 2129 01/19/22 0154 01/19/22 0502  BP: 130/70 (!) 142/68 118/80 (!) 143/74  Pulse: 73 77 72 80  Resp: 18  18 18   Temp: 98.3 F (36.8 C)  98.4 F (36.9 C) 98.9 F (37.2 C)  TempSrc: Oral  Oral Oral  SpO2: 93%  90% 94%  Weight:      Height:        Intake/Output Summary (Last 24 hours) at 01/19/2022 0753 Last data filed at 01/19/2022 0618 Gross per 24 hour  Intake 2946.8 ml  Output 1500 ml  Net 1446.8 ml      01/18/2022    2:25 PM 01/18/2022    5:35 AM 01/17/2022   12:03 AM  Last 3 Weights  Weight (lbs) 140 lb 6.9 oz 140 lb 8 oz 136 lb  Weight (kg) 63.7 kg 63.73 kg 61.689 kg      Telemetry    Not on - Personally Reviewed  ECG    None today - Personally Reviewed  Physical Exam   GEN: mild acute distress.   Neck: No  JVD Cardiac: RRR, no murmurs, rubs, or gallops.  Respiratory: Clear to auscultation bilaterally. GI: Soft, nontender, non-distended  MS: No edema; L leg out straight on a pillow Neuro:  Nonfocal  Psych: Normal affect   Labs    High Sensitivity Troponin:  No results for input(s): "TROPONINIHS" in the last 720 hours.   Chemistry Recent Labs  Lab 01/16/22 0423 01/18/22 0331 01/19/22 0334  NA 136 141 138  K 3.4* 3.9 4.0  CL 106 106 108  CO2 22 28 23   GLUCOSE 102* 113* 123*  BUN 11 15 12   CREATININE 0.57 0.58 0.56  CALCIUM 8.6* 8.8* 8.3*  GFRNONAA >60 >60 >60  ANIONGAP 8 7 7     Lipids No results for input(s): "CHOL", "TRIG", "HDL", "LABVLDL", "LDLCALC", "CHOLHDL" in the last 168 hours.  Hematology Recent Labs  Lab 01/15/22 1916 01/16/22 0423 01/19/22 0334  WBC 11.1* 9.8 11.4*  RBC 3.92 3.72* 3.15*  HGB 12.5 11.6* 10.0*  HCT 38.5 36.4 30.5*  MCV 98.2 97.8 96.8  MCH 31.9 31.2 31.7  MCHC 32.5 31.9 32.8  RDW 12.5 12.7 12.2  PLT 188 183 170   Thyroid No results for input(s): "TSH", "FREET4" in the last  168 hours.  BNPNo results for input(s): "BNP", "PROBNP" in the last 168 hours.  DDimer No results for input(s): "DDIMER" in the last 168 hours.   Radiology    DG Pelvis Portable  Result Date: 01/18/2022 CLINICAL DATA:  Status post left hip arthroplasty. EXAM: PORTABLE PELVIS 1-2 VIEWS COMPARISON:  Preoperative imaging 01/15/2022 FINDINGS: Left hip arthroplasty in expected alignment. No periprosthetic lucency or fracture. Recent postsurgical change includes air and edema in the soft tissues. Lateral skin staples in place. IMPRESSION: Left hip arthroplasty without immediate postoperative complication. Electronically Signed   By: Narda Rutherford M.D.   On: 01/18/2022 17:06   DG HIP UNILAT WITH PELVIS 1V LEFT  Result Date: 01/18/2022 CLINICAL DATA:  Left hip replacement EXAM: DG HIP (WITH OR WITHOUT PELVIS) 1V*L* COMPARISON:  01/15/2022 FINDINGS: Left hip replacement in  satisfactory position and alignment. No fracture or complication IMPRESSION: Satisfactory left hip replacement Electronically Signed   By: Marlan Palau M.D.   On: 01/18/2022 16:34   DG C-Arm 1-60 Min-No Report  Result Date: 01/18/2022 Fluoroscopy was utilized by the requesting physician.  No radiographic interpretation.    Cardiac Studies   ECHO 02/2021   1. Left ventricular ejection fraction, by estimation, is 30 to 35%. The  left ventricle has moderately decreased function. The left ventricle  demonstrates regional wall motion abnormalities (see scoring  diagram/findings for description). Left ventricular  diastolic parameters are consistent with Grade I diastolic dysfunction (impaired relaxation). Calcified trabeculation near apex without obvious thrombus.   2. Right ventricular systolic function is normal. The right ventricular  size is normal. There is normal pulmonary artery systolic pressure. The  estimated right ventricular systolic pressure is 28.6 mmHg.   3. Left atrial size was mildly dilated.   4. The mitral valve is abnormal. Mild mitral valve regurgitation. The  mean mitral valve gradient is 2.0 mmHg.   5. Tricuspid valve regurgitation is moderate.   6. The aortic valve is tricuspid. Aortic valve regurgitation is not  visualized. Aortic valve mean gradient measures 3.0 mmHg.   7. The inferior vena cava is normal in size with greater than 50%  respiratory variability, suggesting right atrial pressure of 3 mmHg.   Patient Profile     76 y.o. female  with a hx of coronary artery disease, ischemic cardiomyopathy EF 30% who was admitted 07/02 for left hip fracture, Cards saw 01/17/2022 preop.  Assessment & Plan    Fall w/ L hip fx s/p repair -  done w/ spinal anesthesia - tolerated well - per Ortho/IM  2. Cardiac issues - pt w/ no ischemic sx, volume status good by exam - MD advise if we can sign off today, I will send message for a f/u appt.    For questions or  updates, please contact [facility] Please consult [email or web address] for contact info under        Signed, Theodore Demark, PA-C  01/19/2022, 7:53 AM    I have seen and examined the patient along with Theodore Demark, PA-C .  I have reviewed the chart, notes and new data.  I agree with PA/NP's note.  Key new complaints: no chest pain and no dyspnea Key examination changes: no JVD, edema or lung rales Key new findings / data: Hgb dropped to 10  PLAN: [facility] will sign off.  Please re-consult if she has new CV issues. Medication Recommendations:  no changes to cardiac meds Other recommendations (labs, testing, etc):  avoid excessive IV fluids Follow up as  an outpatient:  will schedule follow up   Thurmon Fair, MD, [facility] [phone] 01/19/2022, 10:38 AM

## 2022-01-19 NOTE — TOC Progression Note (Addendum)
Transition of Care [facility]) - Progression Note    Patient Details  Name: DORTHIA TOUT MRN: [id number] Date of Birth: [birth year]  Transition of Care [facility]) CM/SW Contact  Amada Jupiter, LCSW Phone Number: 01/19/2022, 3:19 PM  Clinical Narrative:    Pt continues to be agreeable with plan for SNF rehab  and bed search initiated.  ADDENDUM:  Bed offered and pt accepted from [facility].  Starting insurance authorization.    Expected Discharge Plan: Skilled Nursing Facility Barriers to Discharge: Continued Medical Work up, English as a second language teacher  Expected Discharge Plan and Services Expected Discharge Plan: Skilled Nursing Facility In-house Referral: Clinical Social Work   Post Acute Care Choice: Skilled Nursing Facility Living arrangements for the past 2 months: Single Family Home                 DME Arranged: N/A DME Agency: NA                   Social Determinants of Health (SDOH) Interventions    Readmission Risk Interventions    01/18/2022    1:42 PM  Readmission Risk Prevention Plan  Post Dischage Appt Complete  Medication Screening Complete  Transportation Screening Complete

## 2022-01-19 NOTE — Evaluation (Signed)
Occupational Therapy Evaluation Patient Details Name: Destiny Hoffman MRN: [id number] DOB: [birth year] Today's Date: 01/19/2022   History of Present Illness Destiny Hoffman is a 76 y.o. female with medical history significant of hypertension, hyperlipidemia, seizure, CAD s/p stent placement and HFrEF who presents to the emergency department via EMS from home after sustaining a fall. Patient found to have acute minimally impacted left femoral neck fracture. Patient now s/p L THA via anterior approach.   Clinical Impression   Destiny Hoffman is a 76 year old woman who lives at home with her husband and is typically independent. Patient s/p hip fracture repair with decreased ROM and strength of LLE, impaired balance, decreased activity tolerance and iwith complaints of pain resulting in a sudden decline in functional mobility and ability to perform independent ADLs. Patient needing mod-max assist for toileting and LB ADLs and patient needing seated position for UB ADLs. Patient will benefit from skilled OT services while in hospital to improve deficits and learn compensatory strategies as needed in order to return to PLOF.  Recommend short term rehab at discharge.       Recommendations for follow up therapy are one component of a multi-disciplinary discharge planning process, led by the attending physician.  Recommendations may be updated based on patient status, additional functional criteria and insurance authorization.   Follow Up Recommendations  Skilled nursing-short term rehab (<3 hours/day)    Assistance Recommended at Discharge Frequent or constant Supervision/Assistance  Patient can return home with the following A little help with walking and/or transfers;A lot of help with bathing/dressing/bathroom;Assistance with cooking/housework;Help with stairs or ramp for entrance    Functional Status Assessment  Patient has had a recent decline in their functional status and demonstrates  the ability to make significant improvements in function in a reasonable and predictable amount of time.  Equipment Recommendations   (defer)    Recommendations for Other Services       Precautions / Restrictions Precautions Precautions: Fall Restrictions Weight Bearing Restrictions: No RLE Weight Bearing: Weight bearing as tolerated LLE Weight Bearing: Weight bearing as tolerated      Mobility Bed Mobility Overal bed mobility: Needs Assistance Bed Mobility: Supine to Sit     Supine to sit: Mod assist, HOB elevated          Transfers Overall transfer level: Needs assistance Equipment used: Rolling walker (2 wheels)               General transfer comment: Min assist to stand and ambulate with walker.      Balance Overall balance assessment: Needs assistance Sitting-balance support: No upper extremity supported, Feet supported Sitting balance-Leahy Scale: Good     Standing balance support: During functional activity, Reliant on assistive device for balance Standing balance-Leahy Scale: Fair                             ADL either performed or assessed with clinical judgement   ADL Overall ADL's : Needs assistance/impaired Eating/Feeding: Independent   Grooming: Set up;Sitting   Upper Body Bathing: Set up;Sitting   Lower Body Bathing: Moderate assistance;Sit to/from stand   Upper Body Dressing : Set up;Sitting   Lower Body Dressing: Sit to/from stand;Maximal assistance Lower Body Dressing Details (indicate cue type and reason): able to assist with pulling clothing up from knees with one hand Toilet Transfer: Min guard;Rolling walker (2 wheels);Regular Toilet;Grab bars   Toileting- Clothing Manipulation and Hygiene: Moderate assistance;Sit  to/from stand       Functional mobility during ADLs: Minimal assistance;Rolling walker (2 wheels) General ADL Comments: Min assist for steadying to ambulate with walker in hall. Able to briefly release  walkerwith both hands but reports feeling woblly even holding on to walker.     Vision Patient Visual Report: No change from baseline       Perception     Praxis      Pertinent Vitals/Pain Pain Assessment Pain Assessment: Faces Faces Pain Scale: Hurts a little bit Pain Location: L hip Pain Descriptors / Indicators: Grimacing Pain Intervention(s): Monitored during session     Hand Dominance Right   Extremity/Trunk Assessment Upper Extremity Assessment Upper Extremity Assessment: Overall WFL for tasks assessed   Lower Extremity Assessment Lower Extremity Assessment: Defer to PT evaluation   Cervical / Trunk Assessment Cervical / Trunk Assessment: Normal   Communication Communication Communication: No difficulties   Cognition Arousal/Alertness: Awake/alert Behavior During Therapy: WFL for tasks assessed/performed Overall Cognitive Status: Within Functional Limits for tasks assessed                                       General Comments       Exercises     Shoulder Instructions      Home Living Family/patient expects to be discharged to:: Skilled nursing facility Living Arrangements: Spouse/significant other                                      Prior Functioning/Environment Prior Level of Function : Independent/Modified Independent             Mobility Comments: no device ADLs Comments: independent - help her husband and cooks        OT Problem List: Decreased strength;Decreased range of motion;Decreased activity tolerance;Impaired balance (sitting and/or standing);Decreased knowledge of use of DME or AE;Pain      OT Treatment/Interventions: Self-care/ADL training;DME and/or AE instruction;Therapeutic activities;Balance training;Patient/family education    OT Goals(Current goals can be found in the care plan section) Acute Rehab OT Goals Patient Stated Goal: to get home to husband OT Goal Formulation: With  patient Time For Goal Achievement: 02/02/22 Potential to Achieve Goals: Good  OT Frequency: Min 2X/week    Co-evaluation              AM-PAC OT "6 Clicks" Daily Activity     Outcome Measure Help from another person eating meals?: None Help from another person taking care of personal grooming?: A Little Help from another person toileting, which includes using toliet, bedpan, or urinal?: A Lot Help from another person bathing (including washing, rinsing, drying)?: A Little Help from another person to put on and taking off regular upper body clothing?: A Little Help from another person to put on and taking off regular lower body clothing?: A Lot 6 Click Score: 17   End of Session Equipment Utilized During Treatment: Gait belt;Rolling walker (2 wheels) Nurse Communication: Mobility status  Activity Tolerance: Patient tolerated treatment well Patient left: in chair;with call bell/phone within reach;with chair alarm set  OT Visit Diagnosis: Other abnormalities of gait and mobility (R26.89);Pain Pain - Right/Left: Left Pain - part of body: Hip                Time: [id number] OT Time Calculation (min): 21 min Charges:  OT General Charges $OT Visit: 1 Visit OT Evaluation $OT Eval Low Complexity: 1 Low  Eldean Klatt, OTR/L Acute Care Rehab Services  Office [phone] Pager: [phone]   Kelli Churn 01/19/2022, 12:28 PM

## 2022-01-19 NOTE — Care Management Important Message (Signed)
Important Message  Patient Details IM Letter given to the Patient. Name: Destiny Hoffman MRN: [id number] Date of Birth: 1945/12/11   Medicare Important Message Given:  Yes     Caren Macadam 01/19/2022, 1:57 PM

## 2022-01-19 NOTE — Plan of Care (Signed)
?  Problem: Clinical Measurements: ?Goal: Will remain free from infection ?Outcome: Progressing ?  ?

## 2022-01-20 DIAGNOSIS — I2510 Atherosclerotic heart disease of native coronary artery without angina pectoris: Secondary | ICD-10-CM | POA: Diagnosis not present

## 2022-01-20 DIAGNOSIS — I5042 Chronic combined systolic (congestive) and diastolic (congestive) heart failure: Secondary | ICD-10-CM | POA: Diagnosis not present

## 2022-01-20 DIAGNOSIS — S72002A Fracture of unspecified part of neck of left femur, initial encounter for closed fracture: Secondary | ICD-10-CM | POA: Diagnosis not present

## 2022-01-20 DIAGNOSIS — W19XXXA Unspecified fall, initial encounter: Secondary | ICD-10-CM | POA: Diagnosis not present

## 2022-01-20 LAB — CBC
HCT: 29.7 % — ABNORMAL LOW (ref 36.0–46.0)
Hemoglobin: 9.8 g/dL — ABNORMAL LOW (ref 12.0–15.0)
MCH: 32.1 pg (ref 26.0–34.0)
MCHC: 33 g/dL (ref 30.0–36.0)
MCV: 97.4 fL (ref 80.0–100.0)
Platelets: 168 10*3/uL (ref 150–400)
RBC: 3.05 MIL/uL — ABNORMAL LOW (ref 3.87–5.11)
RDW: 12.4 % (ref 11.5–15.5)
WBC: 9.9 10*3/uL (ref 4.0–10.5)
nRBC: 0 % (ref 0.0–0.2)

## 2022-01-20 LAB — BASIC METABOLIC PANEL
Anion gap: 7 (ref 5–15)
BUN: 11 mg/dL (ref 8–23)
CO2: 27 mmol/L (ref 22–32)
Calcium: 8.6 mg/dL — ABNORMAL LOW (ref 8.9–10.3)
Chloride: 105 mmol/L (ref 98–111)
Creatinine, Ser: 0.53 mg/dL (ref 0.44–1.00)
GFR, Estimated: >60 mL/min (ref >60)
Glucose, Bld: 120 mg/dL — ABNORMAL HIGH (ref 70–99)
Potassium: 4 mmol/L (ref 3.5–5.1)
Sodium: 139 mmol/L (ref 135–145)

## 2022-01-20 MED ORDER — SENNA 8.6 MG PO TABS: 1.0000 | ORAL_TABLET | Freq: Two times a day (BID) | ORAL | 0 refills | Status: AC

## 2022-01-20 MED ORDER — ONDANSETRON HCL 4 MG/2ML IJ SOLN: 4.0000 mg | Freq: Four times a day (QID) | INTRAMUSCULAR | 0 refills | Status: AC | PRN

## 2022-01-20 MED ORDER — POLYETHYLENE GLYCOL 3350 17 G PO PACK: 17.0000 g | PACK | Freq: Every day | ORAL | 0 refills | Status: AC | PRN

## 2022-01-20 NOTE — TOC Transition Note (Signed)
Transition of Care [facility]) - CM/SW Discharge Note   Patient Details  Name: Destiny Hoffman MRN: [id number] Date of Birth: Feb 05, 1946  Transition of Care [facility]) CM/SW Contact:  Amada Jupiter, LCSW Phone Number: 01/20/2022, 11:54 AM   Clinical Narrative:    Have received insurance authorization for SNF and pt medically cleared for dc today to [facility].  Pt and spouse aware and agreeable.  PTAR called at 11:55am.  RN to call report to [phone].  No further TOC needs.   Final next level of care: Skilled Nursing Facility Barriers to Discharge: Barriers Resolved   Patient Goals and CMS Choice Patient states their goals for this hospitalization and ongoing recovery are:: eventually return home following SNF rehab      Discharge Placement PASRR number recieved: 01/18/22            Patient chooses bed at: Other - please specify in the comment section below: [facility] Patient to be transferred to facility by: PTAR Name of family member notified: spouse Patient and family notified of of transfer: 01/20/22  Discharge Plan and Services In-house Referral: Clinical Social Work   Post Acute Care Choice: Skilled Nursing Facility          DME Arranged: N/A DME Agency: NA                  Social Determinants of Health (SDOH) Interventions     Readmission Risk Interventions    01/18/2022    1:42 PM  Readmission Risk Prevention Plan  Post Dischage Appt Complete  Medication Screening Complete  Transportation Screening Complete

## 2022-01-20 NOTE — Discharge Summary (Signed)
Physician Discharge Summary  Destiny Hoffman ZOX:[id number] DOB: Sep 10, 1945 DOA: 01/15/2022  PCP: Pearson GrippeKim, James, MD  Admit date: 01/15/2022 Discharge date: 01/20/2022  Admitted From: Inpatient Disposition: SNF  Recommendations for Outpatient Follow-up:  Follow up with ortho per surgery team   Home Health:No Equipment/Devices:no new equipemnt  Discharge Condition:Stable CODE STATUS:Full code Diet recommendation: Regular healthy diet  Brief/Interim Summary: 76-yof w/ CAD-MI s/p BMS LAD 2011 w/ small RCA 80% (med rx), HFrEF, hypertension, hyperlipidemia, seizure disorder presenting after mechanical fall-patient tripped over her cot at home landing on her left side/in ED- CT of the left hip showed left femoral neck fracture with mild impaction.  Orthopedics, Dr. Cyndie ChimeNguyen was consulted and requested transfer to [facility].labd done- EKG showed normal sinus rhythm.  There is no ST-T wave changes. Transferred to [facility] per Dr. Charlann Boxerlin.  Patient underwent surgical repair of left closed femoral neck fracture.  Patient tolerated procedure well and postoperatively did well with physical therapy.  Should be on postoperative prophylaxis to include aspirin 81 mg p.o. twice daily x6 weeks as well as SCDs and TED hose.  Follow-up will be per Ortho postsurgically.  Patient was seen by physical therapy with recommendations for skilled nursing facility.  Patient has received authorization and is stable for transfer there today.  Patient will continue home medications for chronic medical problems to include coronary artery disease, chronic combined systolic and diastolic CHF, ischemic cardiomyopathy, hypertension, hyperlipidemia, seizure disorder.   Discharge Diagnoses:  Principal Problem:   Fracture of femoral neck, left, closed (HCC) Active Problems:   Mixed hyperlipidemia   Coronary atherosclerosis of native coronary artery   Seizure (HCC)   Fall at home, initial encounter   Chronic combined systolic and  diastolic CHF (congestive heart failure) (HCC)   Essential hypertension   Hypokalemia   Malnutrition of moderate degree    Discharge Instructions  Discharge Instructions     Call MD for:  difficulty breathing, headache or visual disturbances   Complete by: As directed    Call MD for:  extreme fatigue   Complete by: As directed    Call MD for:  persistant dizziness or light-headedness   Complete by: As directed    Call MD for:  persistant nausea and vomiting   Complete by: As directed    Call MD for:  redness, tenderness, or signs of infection (pain, swelling, redness, odor or green/yellow discharge around incision site)   Complete by: As directed    Call MD for:  severe uncontrolled pain   Complete by: As directed    Call MD for:  temperature >100.4   Complete by: As directed    Diet - low sodium heart healthy   Complete by: As directed    Discharge wound care:   Complete by: As directed    Per ortho post hip surgery/protocol   Increase activity slowly   Complete by: As directed       Allergies as of 01/20/2022       Reactions   Ace Inhibitors         Medication List     STOP taking these medications    aspirin 81 MG tablet Replaced by: aspirin 81 MG chewable tablet       TAKE these medications    aspirin 81 MG chewable tablet Commonly known as: Aspirin Childrens Chew 1 tablet (81 mg total) by mouth 2 (two) times daily with a meal. Replaces: aspirin 81 MG tablet   carvedilol 6.25 MG tablet Commonly known  as: COREG Take 6.25 mg by mouth 2 (two) times daily.   Fish Oil 1000 MG Caps Take 1 capsule by mouth daily.   HYDROcodone-acetaminophen 10-325 MG tablet Commonly known as: Norco Take 0.5 tablets by mouth every 4 (four) hours as needed.   nitroGLYCERIN 0.4 MG SL tablet Commonly known as: NITROSTAT Place 1 tablet (0.4 mg total) under the tongue every 5 (five) minutes x 3 doses as needed (If no relief after 3rd dose, proceed to the ED for an  evalution).   ondansetron 4 MG/2ML Soln injection Commonly known as: ZOFRAN Inject 2 mLs (4 mg total) into the vein every 6 (six) hours as needed for nausea.   PHENObarbital 30 MG tablet Commonly known as: LUMINAL Take 30 mg by mouth 3 (three) times daily.   polyethylene glycol 17 g packet Commonly known as: MIRALAX / GLYCOLAX Take 17 g by mouth daily as needed for mild constipation.   rosuvastatin 20 MG tablet Commonly known as: Crestor Take 1 tablet (20 mg total) by mouth daily.   senna 8.6 MG Tabs tablet Commonly known as: SENOKOT Take 1 tablet (8.6 mg total) by mouth 2 (two) times daily.               Discharge Care Instructions  (From admission, onward)           Start     Ordered   01/20/22 0000  Discharge wound care:       Comments: Per ortho post hip surgery/protocol   01/20/22 1139            Contact information for follow-up providers     Swinteck, Arlys John, MD. Schedule an appointment as soon as possible for a visit in 2 week(s).   Specialty: Orthopedic Surgery Why: For wound re-check, For suture removal Contact information: [address] [place] [address] [phone]              Contact information for after-discharge care     Destination     [facility] Preferred SNF .   Service: Skilled Nursing Contact information: [address] [place] [address] [phone]                    Allergies  Allergen Reactions   Ace Inhibitors     Consultations: ortho   Procedures/Studies: DG Pelvis Portable  Result Date: 01/18/2022 CLINICAL DATA:  Status post left hip arthroplasty. EXAM: PORTABLE PELVIS 1-2 VIEWS COMPARISON:  Preoperative imaging 01/15/2022 FINDINGS: Left hip arthroplasty in expected alignment. No periprosthetic lucency or fracture. Recent postsurgical change includes air and edema in the soft tissues. Lateral skin staples in place. IMPRESSION: Left hip  arthroplasty without immediate postoperative complication. Electronically Signed   By: Narda Rutherford M.D.   On: 01/18/2022 17:06   DG HIP UNILAT WITH PELVIS 1V LEFT  Result Date: 01/18/2022 CLINICAL DATA:  Left hip replacement EXAM: DG HIP (WITH OR WITHOUT PELVIS) 1V*L* COMPARISON:  01/15/2022 FINDINGS: Left hip replacement in satisfactory position and alignment. No fracture or complication IMPRESSION: Satisfactory left hip replacement Electronically Signed   By: Marlan Palau M.D.   On: 01/18/2022 16:34   DG C-Arm 1-60 Min-No Report  Result Date: 01/18/2022 Fluoroscopy was utilized by the requesting physician.  No radiographic interpretation.   CT HIP LEFT WO CONTRAST  Result Date: 01/15/2022 CLINICAL DATA:  Left hip fracture EXAM: CT OF THE LEFT HIP WITHOUT CONTRAST TECHNIQUE: Multidetector CT imaging of the left hip was performed  according to the standard protocol. Multiplanar CT image reconstructions were also generated. RADIATION DOSE REDUCTION: This exam was performed according to the departmental dose-optimization program which includes automated exposure control, adjustment of the mA and/or kV according to patient size and/or use of iterative reconstruction technique. COMPARISON:  Same-day x-ray FINDINGS: Bones/Joint/Cartilage Acute transcervical left femoral neck fracture with mild impaction. No significant displacement or angulation. Fracture extends to the lateral most margin of the femoral head which may involve the articular surface (series 6, image 59). No fracture involvement of the intertrochanteric region. Hip joint alignment is maintained without dislocation. Joint space is relatively preserved with mild marginal osteophyte formation. Moderate-sized hip joint effusion. Included portion of the left hemipelvis is intact. No additional fractures are seen. No pelvic diastasis. Ligaments Suboptimally assessed by CT. Muscles and Tendons No acute musculotendinous abnormality by CT. Soft  tissues No hematoma within the soft tissues. No left inguinal lymphadenopathy. Diverticular changes of the sigmoid colon. No acute findings within the left hemipelvis. IMPRESSION: 1. Acute transcervical left femoral neck fracture with mild impaction. Fracture extends to the lateral most margin of the femoral head which may involve the articular surface. 2. Moderate-sized hip joint effusion. Electronically Signed   By: Duanne Guess D.O.   On: 01/15/2022 21:36   DG Hip Unilat W or Wo Pelvis 2-3 Views Left  Result Date: 01/15/2022 CLINICAL DATA:  Left hip pain.  Fall EXAM: DG HIP (WITH OR WITHOUT PELVIS) 2-3V LEFT COMPARISON:  None Available. FINDINGS: Acute minimally impacted left femoral neck fracture. No significant displacement or angulation. Bones are demineralized. No dislocation. IMPRESSION: Acute minimally impacted left femoral neck fracture. Electronically Signed   By: Duanne Guess D.O.   On: 01/15/2022 18:45   DG Knee Complete 4 Views Left  Result Date: 01/15/2022 CLINICAL DATA:  left hip pain EXAM: LEFT KNEE - COMPLETE 4+ VIEW COMPARISON:  None Available. FINDINGS: Decreased bone density. No evidence of fracture, dislocation, or joint effusion. No evidence of arthropathy or other focal bone abnormality. Soft tissues are unremarkable. IMPRESSION: No acute displaced fracture or dislocation. Electronically Signed   By: Tish Frederickson M.D.   On: 01/15/2022 18:45      Subjective: Patient did well with physical therapy postoperatively and stable to be transferred to skilled nursing facility for postop rehab.  Discharge Exam: Vitals:   01/19/22 2126 01/20/22 0559  BP: 108/74 (!) 154/71  Pulse: 100 (!) 101  Resp: 18 18  Temp: 98.8 F (37.1 C) 100 F (37.8 C)  SpO2: 97% 95%   Vitals:   01/19/22 1000 01/19/22 1302 01/19/22 2126 01/20/22 0559  BP: 118/76 (!) 110/57 108/74 (!) 154/71  Pulse: 82 95 100 (!) 101  Resp: 18 18 18 18   Temp: 98 F (36.7 C) 98.5 F (36.9 C) 98.8 F (37.1  C) 100 F (37.8 C)  TempSrc: Oral Oral Oral Oral  SpO2: 98% 94% 97% 95%  Weight:      Height:        General: Pt is alert, awake, not in acute distress Cardiovascular: RRR, S1/S2 +, no rubs, no gallops Respiratory: CTA bilaterally, no wheezing, no rhonchi Abdominal: Soft, NT, ND, bowel sounds + Extremities: WWP, nv intact    The results of significant diagnostics from this hospitalization (including imaging, microbiology, ancillary and laboratory) are listed below for reference.     Microbiology: Recent Results (from the past 240 hour(s))  Surgical pcr screen     Status: Abnormal   Collection Time: 01/17/22  5:17 PM  Specimen: Nasal Mucosa; Nasal Swab  Result Value Ref Range Status   MRSA, PCR NEGATIVE NEGATIVE Final   Staphylococcus aureus POSITIVE (A) NEGATIVE Final    Comment: (NOTE) The Xpert SA Assay (FDA approved for NASAL specimens in patients 83 years of age and older), is one component of a comprehensive surveillance program. It is not intended to diagnose infection nor to guide or monitor treatment. Performed at [facility], [address], [place], [place] [address]      Labs: BNP (last 3 results) No results for input(s): "BNP" in the last 8760 hours. Basic Metabolic Panel: Recent Labs  Lab 01/15/22 1916 01/16/22 0423 01/18/22 0331 01/19/22 0334 01/20/22 0348  NA 139 136 141 138 139  K 4.5 3.4* 3.9 4.0 4.0  CL 106 106 106 108 105  CO2 26 22 28 23 27   GLUCOSE 120* 102* 113* 123* 120*  BUN 11 11 15 12 11   CREATININE 0.56 0.57 0.58 0.56 0.53  CALCIUM 9.3 8.6* 8.8* 8.3* 8.6*   Liver Function Tests: No results for input(s): "AST", "ALT", "ALKPHOS", "BILITOT", "PROT", "ALBUMIN" in the last 168 hours. No results for input(s): "LIPASE", "AMYLASE" in the last 168 hours. No results for input(s): "AMMONIA" in the last 168 hours. CBC: Recent Labs  Lab 01/15/22 1916 01/16/22 0423 01/19/22 0334 01/20/22 0348  WBC 11.1* 9.8 11.4*  9.9  NEUTROABS 9.5*  --   --   --   HGB 12.5 11.6* 10.0* 9.8*  HCT 38.5 36.4 30.5* 29.7*  MCV 98.2 97.8 96.8 97.4  PLT 188 183 170 168   Cardiac Enzymes: No results for input(s): "CKTOTAL", "CKMB", "CKMBINDEX", "TROPONINI" in the last 168 hours. BNP: Invalid input(s): "POCBNP" CBG: No results for input(s): "GLUCAP" in the last 168 hours. D-Dimer No results for input(s): "DDIMER" in the last 72 hours. Hgb A1c No results for input(s): "HGBA1C" in the last 72 hours. Lipid Profile No results for input(s): "CHOL", "HDL", "LDLCALC", "TRIG", "CHOLHDL", "LDLDIRECT" in the last 72 hours. Thyroid function studies No results for input(s): "TSH", "T4TOTAL", "T3FREE", "THYROIDAB" in the last 72 hours.  Invalid input(s): "FREET3" Anemia work up No results for input(s): "VITAMINB12", "FOLATE", "FERRITIN", "TIBC", "IRON", "RETICCTPCT" in the last 72 hours. Urinalysis    Component Value Date/Time   COLORURINE COLORLESS (A) 01/15/2022 2001   APPEARANCEUR CLEAR 01/15/2022 2001   LABSPEC 1.004 (L) 01/15/2022 2001   PHURINE 7.0 01/15/2022 2001   GLUCOSEU NEGATIVE 01/15/2022 2001   HGBUR SMALL (A) 01/15/2022 2001   BILIRUBINUR NEGATIVE 01/15/2022 2001   KETONESUR 5 (A) 01/15/2022 2001   PROTEINUR NEGATIVE 01/15/2022 2001   NITRITE NEGATIVE 01/15/2022 2001   LEUKOCYTESUR NEGATIVE 01/15/2022 2001   Sepsis Labs Recent Labs  Lab 01/15/22 1916 01/16/22 0423 01/19/22 0334 01/20/22 0348  WBC 11.1* 9.8 11.4* 9.9   Microbiology Recent Results (from the past 240 hour(s))  Surgical pcr screen     Status: Abnormal   Collection Time: 01/17/22  5:17 PM   Specimen: Nasal Mucosa; Nasal Swab  Result Value Ref Range Status   MRSA, PCR NEGATIVE NEGATIVE Final   Staphylococcus aureus POSITIVE (A) NEGATIVE Final    Comment: (NOTE) The Xpert SA Assay (FDA approved for NASAL specimens in patients 58 years of age and older), is one component of a comprehensive surveillance program. It is not intended  to diagnose infection nor to guide or monitor treatment. Performed at [facility], [address], [place], [place]      Time  coordinating discharge: Over 30 minutes  SIGNED:   Burke Keels, MD  [facility] 01/20/2022, 11:39 AM Pager   If 7PM-7AM, please contact night-coverage [email or web address] Password [email or web address]

## 2022-01-20 NOTE — Progress Notes (Signed)
    Subjective:  Patient reports pain as mild to moderate.  Denies N/V/CP/SOB/Abd pain. Patient is very fatigued yesterday and today. She just wants to sleep. Currently her pain is pretty mild. Denies tingling and numbness in LE bilaterally.   Objective:   VITALS:   Vitals:   01/19/22 1000 01/19/22 1302 01/19/22 2126 01/20/22 0559  BP: 118/76 (!) 110/57 108/74 (!) 154/71  Pulse: 82 95 100 (!) 101  Resp: 18 18 18 18   Temp: 98 F (36.7 C) 98.5 F (36.9 C) 98.8 F (37.1 C) 100 F (37.8 C)  TempSrc: Oral Oral Oral Oral  SpO2: 98% 94% 97% 95%  Weight:      Height:        Patient sleeping in recliner this morning. Woke patient up for exam. NAD.  Neurologically intact ABD soft Neurovascular intact Sensation intact distally Intact pulses distally Dorsiflexion/Plantar flexion intact Incision: dressing C/D/I No cellulitis present Compartment soft Slight bruising distal to incision.   Lab Results  Component Value Date   WBC 9.9 01/20/2022   HGB 9.8 (L) 01/20/2022   HCT 29.7 (L) 01/20/2022   MCV 97.4 01/20/2022   PLT 168 01/20/2022   BMET    Component Value Date/Time   NA 139 01/20/2022 0348   K 4.0 01/20/2022 0348   CL 105 01/20/2022 0348   CO2 27 01/20/2022 0348   GLUCOSE 120 (H) 01/20/2022 0348   BUN 11 01/20/2022 0348   CREATININE 0.53 01/20/2022 0348   CALCIUM 8.6 (L) 01/20/2022 0348   GFRNONAA >60 01/20/2022 0348     Assessment/Plan: 2 Days Post-Op   Principal Problem:   Fracture of femoral neck, left, closed (HCC) Active Problems:   Mixed hyperlipidemia   Coronary atherosclerosis of native coronary artery   Seizure (HCC)   Fall at home, initial encounter   Chronic combined systolic and diastolic CHF (congestive heart failure) (HCC)   Essential hypertension   Hypokalemia   Malnutrition of moderate degree   WBAT with walker DVT ppx: Lovenox per hospitalist. Will transition to aspirin 81mg  BID at discharge, SCDs, TEDS PO pain control PT/OT:  Patient ambulated with PT yesterday 22 feet but was limited due to fatigue and nausea. PT entering room this morning to work with her.  Dispo:  Disposition per medical team. Likely d/c to SNF per notes. Pain medication and DVT ppx printed in chart.    03/23/2022, PA-C 01/20/2022, 8:22 AM  [facility]  [facility] [address], [address], [place], [address] [place] Phone: [phone] [email or web address] Facebook  [phone]

## 2022-01-20 NOTE — Progress Notes (Signed)
Physical Therapy Treatment Patient Details Name: Destiny Hoffman MRN: [id number] DOB: [birth year] Today's Date: 01/20/2022   History of Present Illness Destiny Hoffman is a 76 y.o. female with medical history significant of hypertension, hyperlipidemia, seizure, CAD s/p stent placement and HFrEF who presents to the emergency department via EMS from home after sustaining a fall. Patient found to have acute minimally impacted left femoral neck fracture. Patient now s/p L THA via anterior approach.    PT Comments    Pt continues very cooperative and progressing steadily with mobility including ambulating increased (but still limited) distance in hall and with decreasing assist level required for most tasks.   Recommendations for follow up therapy are one component of a multi-disciplinary discharge planning process, led by the attending physician.  Recommendations may be updated based on patient status, additional functional criteria and insurance authorization.  Follow Up Recommendations  Skilled nursing-short term rehab (<3 hours/day) Can patient physically be transported by private vehicle: Yes   Assistance Recommended at Discharge Frequent or constant Supervision/Assistance  Patient can return home with the following A little help with walking and/or transfers;A little help with bathing/dressing/bathroom;Assistance with cooking/housework;Assist for transportation;Help with stairs or ramp for entrance   Equipment Recommendations  None recommended by PT    Recommendations for Other Services       Precautions / Restrictions Precautions Precautions: Fall Restrictions Weight Bearing Restrictions: No RLE Weight Bearing: Weight bearing as tolerated LLE Weight Bearing: Weight bearing as tolerated     Mobility  Bed Mobility Overal bed mobility: Needs Assistance Bed Mobility: Sit to Supine       Sit to supine: Mod assist   General bed mobility comments: cues for sequence and  assist to manage Bil LEs onto bed    Transfers Overall transfer level: Needs assistance Equipment used: Rolling walker (2 wheels) Transfers: Sit to/from Stand Sit to Stand: Min assist           General transfer comment: cues for LE management and use of UEs to self assist    Ambulation/Gait Ambulation/Gait assistance: Min assist Gait Distance (Feet): 38 Feet Assistive device: Rolling walker (2 wheels) Gait Pattern/deviations: Step-to pattern, Decreased step length - right, Decreased step length - left, Shuffle, Antalgic, Trunk flexed Gait velocity: decr     General Gait Details: Increased time with cues for sequence, posture and position from [employer]             Wheelchair Mobility    Modified Rankin (Stroke Patients Only)       Balance Overall balance assessment: Needs assistance Sitting-balance support: No upper extremity supported, Feet supported Sitting balance-Leahy Scale: Good     Standing balance support: During functional activity, Reliant on assistive device for balance Standing balance-Leahy Scale: Fair                              Cognition Arousal/Alertness: Awake/alert Behavior During Therapy: WFL for tasks assessed/performed Overall Cognitive Status: Within Functional Limits for tasks assessed                                          Exercises Total Joint Exercises Ankle Circles/Pumps: AROM, Both, 15 reps, Supine Quad Sets: AROM, Both, 10 reps, Supine Heel Slides: AAROM, Left, 20 reps, Supine Hip ABduction/ADduction: AAROM, Left, 15 reps, Supine  General Comments        Pertinent Vitals/Pain Pain Assessment Pain Assessment: Faces Faces Pain Scale: Hurts little more Pain Location: L hip Pain Descriptors / Indicators: Grimacing Pain Intervention(s): Limited activity within patient's tolerance, Monitored during session (pt declines pain meds and ice)    Home Living                           Prior Function            PT Goals (current goals can now be found in the care plan section) Acute Rehab PT Goals Patient Stated Goal: Regain IND PT Goal Formulation: With patient Time For Goal Achievement: 01/26/22 Potential to Achieve Goals: Good Progress towards PT goals: Progressing toward goals    Frequency    Min 3X/week      PT Plan Current plan remains appropriate    Co-evaluation              AM-PAC PT "6 Clicks" Mobility   Outcome Measure  Help needed turning from your back to your side while in a flat bed without using bedrails?: A Lot Help needed moving from lying on your back to sitting on the side of a flat bed without using bedrails?: A Little Help needed moving to and from a bed to a chair (including a wheelchair)?: A Little Help needed standing up from a chair using your arms (e.g., wheelchair or bedside chair)?: A Little Help needed to walk in hospital room?: A Lot Help needed climbing 3-5 steps with a railing? : A Lot 6 Click Score: 15    End of Session Equipment Utilized During Treatment: Gait belt Activity Tolerance: Patient tolerated treatment well Patient left: with call bell/phone within reach;in bed;with bed alarm set Nurse Communication: Mobility status PT Visit Diagnosis: Difficulty in walking, not elsewhere classified (R26.2)     Time: 5993-5701 PT Time Calculation (min) (ACUTE ONLY): 34 min  Charges:  $Gait Training: 8-22 mins $Therapeutic Exercise: 8-22 mins                     Destiny Hoffman PT Acute Rehabilitation Services Pager [phone] Office [phone]    Destiny Hoffman 01/20/2022, 9:06 AM

## 2022-01-20 NOTE — Progress Notes (Signed)
Attempted to call report to [facility]. Waited on hold for 9 minutes, nobody answered. Will attempt to call later.

## 2022-01-25 DIAGNOSIS — G40909 Epilepsy, unspecified, not intractable, without status epilepticus: Secondary | ICD-10-CM | POA: Diagnosis not present

## 2022-01-25 DIAGNOSIS — E785 Hyperlipidemia, unspecified: Secondary | ICD-10-CM | POA: Diagnosis not present

## 2022-01-25 DIAGNOSIS — S72002D Fracture of unspecified part of neck of left femur, subsequent encounter for closed fracture with routine healing: Secondary | ICD-10-CM | POA: Diagnosis not present

## 2022-01-25 DIAGNOSIS — I10 Essential (primary) hypertension: Secondary | ICD-10-CM | POA: Diagnosis not present

## 2022-02-03 DIAGNOSIS — S72032D Displaced midcervical fracture of left femur, subsequent encounter for closed fracture with routine healing: Secondary | ICD-10-CM | POA: Diagnosis not present

## 2022-02-22 DIAGNOSIS — G4089 Other seizures: Secondary | ICD-10-CM | POA: Diagnosis not present

## 2022-02-22 DIAGNOSIS — I10 Essential (primary) hypertension: Secondary | ICD-10-CM | POA: Diagnosis not present

## 2022-02-22 DIAGNOSIS — E785 Hyperlipidemia, unspecified: Secondary | ICD-10-CM | POA: Diagnosis not present

## 2022-02-22 DIAGNOSIS — S72002D Fracture of unspecified part of neck of left femur, subsequent encounter for closed fracture with routine healing: Secondary | ICD-10-CM | POA: Diagnosis not present

## 2022-05-24 ENCOUNTER — Ambulatory Visit: Payer: Medicare Other | Admitting: Cardiology

## 2022-05-24 NOTE — Progress Notes (Deleted)
Cardiology Office Note  Date: 05/24/2022   ID: Destiny Hoffman, DOB 03-19-1946, MRN [id number]  PCP:  Pearson Grippe, MD  Cardiologist:  Nona Dell, MD Electrophysiologist:  None   No chief complaint on file.   History of Present Illness: Destiny Hoffman is a 76 y.o. female last seen in March by [name] PA-C, I reviewed the note.  Last echocardiogram was in August 2022 at which point LVEF was in the 30 to 35% range as described below.  She has declined ICD.  Past Medical History:  Diagnosis Date   Coronary atherosclerosis of native coronary artery    BMS to LAD 2011, residual RCA disease managed medically   HTN (hypertension)    Hyperkalemia on ACE inhibitors    Hyperlipidemia LDL goal <70    Ischemic cardiomyopathy    LVEF 25-30% echo 2014   Myocardial infarction, anterior wall (HCC) 07/17/2009   Seizure disorder (HCC)     Past Surgical History:  Procedure Laterality Date   CORONARY ANGIOPLASTY WITH STENT PLACEMENT  2011   BMS LAD   TOTAL HIP ARTHROPLASTY Left 01/18/2022   Procedure: TOTAL HIP ARTHROPLASTY ANTERIOR APPROACH;  Surgeon: Samson Frederic, MD;  Location: [facility];  Service: Orthopedics;  Laterality: Left;    Current Outpatient Medications  Medication Sig Dispense Refill   carvedilol (COREG) 6.25 MG tablet Take 6.25 mg by mouth 2 (two) times daily.     HYDROcodone-acetaminophen (NORCO) 10-325 MG tablet Take 0.5 tablets by mouth every 4 (four) hours as needed. 30 tablet 0   nitroGLYCERIN (NITROSTAT) 0.4 MG SL tablet Place 1 tablet (0.4 mg total) under the tongue every 5 (five) minutes x 3 doses as needed (If no relief after 3rd dose, proceed to the ED for an evalution). 25 tablet 3   Omega-3 Fatty Acids (FISH OIL) 1000 MG CAPS Take 1 capsule by mouth daily.     ondansetron (ZOFRAN) 4 MG/2ML SOLN injection Inject 2 mLs (4 mg total) into the vein every 6 (six) hours as needed for nausea. 2 mL 0   PHENObarbital (LUMINAL) 30 MG tablet Take 30 mg by mouth 3  (three) times daily.     polyethylene glycol (MIRALAX / GLYCOLAX) 17 g packet Take 17 g by mouth daily as needed for mild constipation. 14 each 0   rosuvastatin (CRESTOR) 20 MG tablet Take 1 tablet (20 mg total) by mouth daily. 30 tablet 6   senna (SENOKOT) 8.6 MG TABS tablet Take 1 tablet (8.6 mg total) by mouth 2 (two) times daily. 120 tablet 0   No current facility-administered medications for this visit.   Allergies:  Ace inhibitors   Social History: The patient  reports that she quit smoking about 11 years ago. Her smoking use included cigarettes. She has never used smokeless tobacco. She reports that she does not drink alcohol and does not use drugs.   Family History: The patient's family history includes Heart attack in her father.   ROS:  Please see the history of present illness. Otherwise, complete review of systems is positive for {NONE DEFAULTED:18576}.  All other systems are reviewed and negative.   Physical Exam: VS:  There were no vitals taken for this visit., BMI There is no height or weight on file to calculate BMI.  Wt Readings from Last 3 Encounters:  01/18/22 140 lb 6.9 oz (63.7 kg)  09/16/21 136 lb 3.2 oz (61.8 kg)  02/02/21 131 lb (59.4 kg)    General: Patient appears comfortable at rest.  HEENT: Conjunctiva and lids normal, oropharynx clear with moist mucosa. Neck: Supple, no elevated JVP or carotid bruits, no thyromegaly. Lungs: Clear to auscultation, nonlabored breathing at rest. Cardiac: Regular rate and rhythm, no S3 or significant systolic murmur, no pericardial rub. Abdomen: Soft, nontender, no hepatomegaly, bowel sounds present, no guarding or rebound. Extremities: No pitting edema, distal pulses 2+. Skin: Warm and dry. Musculoskeletal: No kyphosis. Neuropsychiatric: Alert and oriented x3, affect grossly appropriate.  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  Recent  Labwork: 01/20/2022: BUN 11; Creatinine, Ser 0.53; Hemoglobin 9.8; Platelets 168; Potassium 4.0; Sodium 139     Component Value Date/Time   CHOL 119 02/15/2012 1156   TRIG 74.0 02/15/2012 1156   HDL 66.70 02/15/2012 1156   CHOLHDL 2 02/15/2012 1156   VLDL 14.8 02/15/2012 1156   LDLCALC 38 02/15/2012 1156    Other Studies Reviewed Today:  Echocardiogram 02/15/2021:  1. Left ventricular ejection fraction, by estimation, is 30 to 35%. The  left ventricle has moderately decreased function. The left ventricle  demonstrates regional wall motion abnormalities (see scoring  diagram/findings for description). Left ventricular   diastolic parameters are consistent with Grade I diastolic dysfunction  (impaired relaxation). Calcified trabeculation near apex without obvious  thrombus.   2. Right ventricular systolic function is normal. The right ventricular  size is normal. There is normal pulmonary artery systolic pressure. The  estimated right ventricular systolic pressure is 28.6 mmHg.   3. Left atrial size was mildly dilated.   4. The mitral valve is abnormal. Mild mitral valve regurgitation. The  mean mitral valve gradient is 2.0 mmHg.   5. Tricuspid valve regurgitation is moderate.   6. The aortic valve is tricuspid. Aortic valve regurgitation is not  visualized. Aortic valve mean gradient measures 3.0 mmHg.   7. The inferior vena cava is normal in size with greater than 50%  respiratory variability, suggesting right atrial pressure of 3 mmHg.    Assessment and Plan:   Medication Adjustments/Labs and Tests Ordered: Current medicines are reviewed at length with the patient today.  Concerns regarding medicines are outlined above.   Tests Ordered: No orders of the defined types were placed in this encounter.   Medication Changes: No orders of the defined types were placed in this encounter.   Disposition:  Follow up {follow up:15908}  Signed, Jonelle Sidle, MD,  [facility] 05/24/2022 11:09 AM    [facility] at [facility] [address], [place], [place] [address] Phone: [phone]; Fax: [phone]

## 2023-02-22 ENCOUNTER — Ambulatory Visit: Payer: Self-pay | Attending: Cardiology | Admitting: Cardiology

## 2023-02-22 NOTE — Progress Notes (Deleted)
    Cardiology Office Note  Date: 02/22/2023   ID: Destiny Hoffman, DOB August 22, 1945, MRN [id number]  History of Present Illness: Destiny Hoffman is a 77 y.o. female last seen in March 2023 by [name] PA-C, I reviewed the note (our last visit was in 2022).  Physical Exam: VS:  There were no vitals taken for this visit., BMI There is no height or weight on file to calculate BMI.  Wt Readings from Last 3 Encounters:  01/18/22 140 lb 6.9 oz (63.7 kg)  09/16/21 136 lb 3.2 oz (61.8 kg)  02/02/21 131 lb (59.4 kg)    General: Patient appears comfortable at rest. HEENT: Conjunctiva and lids normal, oropharynx clear with moist mucosa. Neck: Supple, no elevated JVP or carotid bruits, no thyromegaly. Lungs: Clear to auscultation, nonlabored breathing at rest. Cardiac: Regular rate and rhythm, no S3 or significant systolic murmur, no pericardial rub. Abdomen: Soft, nontender, no hepatomegaly, bowel sounds present, no guarding or rebound. Extremities: No pitting edema, distal pulses 2+. Skin: Warm and dry. Musculoskeletal: No kyphosis. Neuropsychiatric: Alert and oriented x3, affect grossly appropriate.  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  Labwork:  January 2024: Hemoglobin 12.5, platelets 249, BUN 11, creatinine 0.73, potassium 5.1, AST 17, ALT 8, cholesterol 167, triglycerides 104, HDL 60, LDL 88, hemoglobin A1c 5.8%, TSH 1.29  Other Studies Reviewed Today:  No interval cardiac testing for review today.  Assessment and Plan:  1.  CAD status post myocardial infarction with BMS to the LAD in 2011 with residual RCA disease managed medically.  2.  HFrEF with ischemic cardiomyopathy, LVEF 30 to 35% by echocardiogram in August 2022.  She has declined ICD.  3.  Essential hypertension.  4.  History of hyperkalemia on ACE inhibitor.  Disposition:  Follow up {follow  up:15908}  Signed, Jonelle Sidle, M.D., F.A.C.C. [facility] at [facility]

## 2023-02-23 ENCOUNTER — Encounter: Payer: Self-pay | Admitting: Cardiology

## 2023-02-27 ENCOUNTER — Telehealth: Payer: Self-pay | Admitting: Cardiology

## 2023-02-27 NOTE — Telephone Encounter (Signed)
FYI.  °Contacted patient regarding recall appointment, patient notified our office they did not wish to keep this appointment at this time.  Deleted recall from system. °
